# Patient Record
Sex: Female | Born: 1970 | Race: Black or African American | Hispanic: No | Marital: Single | Smoking: Never smoker
Health system: Southern US, Community
[De-identification: ages and names within clinical notes are randomized; demographics above are authoritative.]

## PROBLEM LIST (undated history)

## (undated) HISTORY — PX: ANKLE SURGERY: SHX546

---

## 1993-07-24 HISTORY — PX: UTERINE FIBROID SURGERY: SHX826

## 1997-12-28 ENCOUNTER — Ambulatory Visit (HOSPITAL_COMMUNITY): Admission: RE | Admit: 1997-12-28 | Discharge: 1997-12-28 | Payer: Self-pay | Admitting: Obstetrics and Gynecology

## 1998-03-15 ENCOUNTER — Inpatient Hospital Stay (HOSPITAL_COMMUNITY): Admission: AD | Admit: 1998-03-15 | Discharge: 1998-03-18 | Payer: Self-pay | Admitting: Obstetrics and Gynecology

## 2000-10-23 ENCOUNTER — Emergency Department (HOSPITAL_COMMUNITY): Admission: EM | Admit: 2000-10-23 | Discharge: 2000-10-23 | Payer: Self-pay | Admitting: Emergency Medicine

## 2001-04-17 ENCOUNTER — Other Ambulatory Visit: Admission: RE | Admit: 2001-04-17 | Discharge: 2001-04-17 | Payer: Self-pay | Admitting: Obstetrics and Gynecology

## 2001-05-29 ENCOUNTER — Encounter (INDEPENDENT_AMBULATORY_CARE_PROVIDER_SITE_OTHER): Payer: Self-pay | Admitting: Specialist

## 2001-05-29 ENCOUNTER — Inpatient Hospital Stay (HOSPITAL_COMMUNITY): Admission: RE | Admit: 2001-05-29 | Discharge: 2001-05-31 | Payer: Self-pay | Admitting: Obstetrics and Gynecology

## 2001-07-24 HISTORY — PX: ABDOMINAL HYSTERECTOMY: SHX81

## 2004-02-17 ENCOUNTER — Emergency Department (HOSPITAL_COMMUNITY): Admission: EM | Admit: 2004-02-17 | Discharge: 2004-02-17 | Payer: Self-pay | Admitting: Emergency Medicine

## 2004-09-27 ENCOUNTER — Ambulatory Visit: Payer: Self-pay | Admitting: Family Medicine

## 2006-01-08 ENCOUNTER — Ambulatory Visit: Payer: Self-pay | Admitting: Family Medicine

## 2006-02-02 ENCOUNTER — Ambulatory Visit: Payer: Self-pay | Admitting: Family Medicine

## 2007-01-24 ENCOUNTER — Ambulatory Visit: Payer: Self-pay | Admitting: Family Medicine

## 2007-01-24 LAB — CONVERTED CEMR LAB
ALT: 18 units/L (ref 0–35)
Albumin: 3.6 g/dL (ref 3.5–5.2)
Alkaline Phosphatase: 96 units/L (ref 39–117)
BUN: 10 mg/dL (ref 6–23)
Basophils Absolute: 0 10*3/uL (ref 0.0–0.1)
Basophils Relative: 0.4 % (ref 0.0–1.0)
CO2: 27 meq/L (ref 19–32)
Calcium: 9.2 mg/dL (ref 8.4–10.5)
GFR calc Af Amer: 145 mL/min
GFR calc non Af Amer: 120 mL/min
Lymphocytes Relative: 39.7 % (ref 12.0–46.0)
Monocytes Relative: 6.4 % (ref 3.0–11.0)
Neutro Abs: 2.8 10*3/uL (ref 1.4–7.7)
Platelets: 298 10*3/uL (ref 150–400)
Saturation Ratios: 34.3 % (ref 20.0–50.0)
TSH: 0.84 microintl units/mL (ref 0.35–5.50)

## 2007-02-01 ENCOUNTER — Ambulatory Visit: Payer: Self-pay | Admitting: Family Medicine

## 2007-02-01 LAB — CONVERTED CEMR LAB
Cholesterol: 170 mg/dL (ref 0–200)
HDL: 46.4 mg/dL (ref >39.0)
Triglycerides: 87 mg/dL (ref 0–149)

## 2007-07-11 ENCOUNTER — Telehealth: Payer: Self-pay | Admitting: Family Medicine

## 2007-07-19 ENCOUNTER — Telehealth: Payer: Self-pay | Admitting: Internal Medicine

## 2007-08-07 ENCOUNTER — Emergency Department (HOSPITAL_COMMUNITY): Admission: EM | Admit: 2007-08-07 | Discharge: 2007-08-07 | Payer: Self-pay | Admitting: *Deleted

## 2007-08-09 ENCOUNTER — Ambulatory Visit: Payer: Self-pay | Admitting: Family Medicine

## 2007-08-09 DIAGNOSIS — I491 Atrial premature depolarization: Secondary | ICD-10-CM

## 2008-08-17 ENCOUNTER — Telehealth: Payer: Self-pay | Admitting: *Deleted

## 2008-11-04 DIAGNOSIS — B372 Candidiasis of skin and nail: Secondary | ICD-10-CM | POA: Insufficient documentation

## 2008-11-06 ENCOUNTER — Ambulatory Visit: Payer: Self-pay | Admitting: Family Medicine

## 2009-01-23 ENCOUNTER — Emergency Department (HOSPITAL_COMMUNITY): Admission: EM | Admit: 2009-01-23 | Discharge: 2009-01-23 | Payer: Self-pay | Admitting: Emergency Medicine

## 2009-03-14 ENCOUNTER — Emergency Department (HOSPITAL_COMMUNITY): Admission: EM | Admit: 2009-03-14 | Discharge: 2009-03-14 | Payer: Self-pay | Admitting: Emergency Medicine

## 2009-08-30 ENCOUNTER — Telehealth: Payer: Self-pay | Admitting: Family Medicine

## 2009-09-01 ENCOUNTER — Telehealth: Payer: Self-pay | Admitting: Family Medicine

## 2010-08-25 NOTE — Progress Notes (Signed)
Summary: REQ FOR MED RX  Phone Note Call from Patient   Caller: Patient [phone] Reason for Call: Refill Medication Summary of Call: Pt called to adv that her insurance pays for the Ibuprofen 800 mg and if she has to get it OTC it will come out of her expenses.... Pt req RX for same be called into [facility]  [address]  Pt can be reached at [phone] with any questions or concerns.  Initial call taken by: Debbra Riding,  September 01, 2009 1:31 PM  Follow-up for Phone Call        Motrin 800 mg, dispense 60 tablets, directions one p.o. b.i.d., refills x 2 Follow-up by: Roderick Pee MD,  September 01, 2009 1:34 PM  Additional Follow-up for Phone Call Additional follow up Details #1::        Rx Called In Additional Follow-up by: Kern Reap CMA Duncan Dull),  September 02, 2009 4:11 PM

## 2010-08-25 NOTE — Progress Notes (Signed)
Summary: refill  Phone Note From Pharmacy   Summary of Call: patient would like a refill of ibuprofen 800 is this okay to fill? Initial call taken by: Kern Reap CMA Duncan Dull),  August 30, 2009 8:49 AM  Follow-up for Phone Call        Motrin 800 mg b.i.d., dispensed 60 tabs, refills x 2........ inform patient if she can get this OTC without a prescription.......... also she is due for a complete physical examination this spring Follow-up by: Roderick Pee MD,  August 30, 2009 11:35 AM  Additional Follow-up for Phone Call Additional follow up Details #1::        denied and sent to pharmacy with note Additional Follow-up by: Kern Reap CMA Duncan Dull),  August 30, 2009 2:55 PM

## 2010-10-29 LAB — DIFFERENTIAL
Basophils Relative: 1 % (ref 0–1)
Eosinophils Absolute: 0.1 10*3/uL (ref 0.0–0.7)
Eosinophils Relative: 2 % (ref 0–5)
Monocytes Relative: 4 % (ref 3–12)
Neutrophils Relative %: 51 % (ref 43–77)

## 2010-10-29 LAB — CBC
HCT: 41 % (ref 36.0–46.0)
MCHC: 34.1 g/dL (ref 30.0–36.0)
MCV: 89.9 fL (ref 78.0–100.0)
Platelets: 322 10*3/uL (ref 150–400)

## 2010-10-29 LAB — BASIC METABOLIC PANEL
BUN: 12 mg/dL (ref 6–23)
CO2: 24 mEq/L (ref 19–32)
Chloride: 104 mEq/L (ref 96–112)
Creatinine, Ser: 0.71 mg/dL (ref 0.4–1.2)
Potassium: 3.4 mEq/L — ABNORMAL LOW (ref 3.5–5.1)

## 2010-12-06 NOTE — Op Note (Signed)
NAMEJACKELYNN, Jody Solis NO.:  [id number]   MEDICAL RECORD NO.:  [id number]          PATIENT TYPE:  EMS   LOCATION:  ED                           FACILITY:  [facility]   PHYSICIAN:  Claude Manges. Whitfield, M.D.DATE OF BIRTH:  10-Dec-1970   DATE OF PROCEDURE:  03/14/2009  DATE OF DISCHARGE:  03/14/2009                               OPERATIVE REPORT   PREOPERATIVE DIAGNOSIS:  Displaced trimalleolar fracture right ankle  with dislocation of right ankle.   POSTOPERATIVE DIAGNOSIS:  Displaced trimalleolar fracture right ankle  with dislocation of right ankle.   PROCEDURE:  Closed reduction of trimalleolar fracture right ankle and  dislocation of right ankle.   SURGEON:  Claude Manges. Cleophas Dunker, MD   ASSISTANT:  Oris Drone. Petrarca, P.A.-C.   ANESTHESIA:  IV fentanyl and IV etomidate.   COMPLICATIONS:  None.   HISTORY:  A 40 year old female had fallen while roller skating and  sustained immediate onset of right ankle pain associated with  deformities.  She was transported to the [facility] Emergency Room  where x-rays were obtained.  She has a displaced trimalleolar fracture  of the right ankle associated with posterior lateral dislocation.  She  is now to have closed reduction of the fracture and dislocation.   PROCEDURE:  The patient came to the Emergency Room in a posterior  splint, this was left in place.  She had some initial tingling in her  toes, but had good pulses.  There was obvious lateral and posterior  position of the foot in relationship to the ankle.  Skin was intact.  There was no bleeding.   The emergency room physician administered IV fentanyl and the etomidate.  The patient was then at that point very comfortable, was unaware for  pain.  A closed reduction of the fracture was performed with an audible  and palpable relocation with the ankle at approximately neutral  position.  A bulky soft dressing associated with the posterior splint  was then applied.   The posterior tibial and dorsalis pedis pulses were  bonding and the patient did upon awakening and felt that she had better  since she had normal sensibility.  She was very comfortable at that  point.   Plan; walker, oxycodone for pain, and we will plan to proceed with open  reduction internal fixation of the trimalleolar fracture within the next  5-7 days as the swelling subsides and we plan to check her back in the  office prior to scheduling the surgery.     Claude Manges. Cleophas Dunker, M.D.  Electronically Signed    PWW/[id number]  D:  03/14/2009  T:  03/15/2009  Job:  [id number]

## 2010-12-07 ENCOUNTER — Inpatient Hospital Stay (HOSPITAL_COMMUNITY)
Admission: AD | Admit: 2010-12-07 | Discharge: 2010-12-07 | Disposition: A | Discharge status: Home / Self Care | Payer: Medicaid Other | Source: Ambulatory Visit | Attending: Family Medicine | Admitting: Family Medicine

## 2010-12-07 DIAGNOSIS — N949 Unspecified condition associated with female genital organs and menstrual cycle: Secondary | ICD-10-CM | POA: Insufficient documentation

## 2010-12-07 DIAGNOSIS — N938 Other specified abnormal uterine and vaginal bleeding: Secondary | ICD-10-CM | POA: Insufficient documentation

## 2010-12-07 LAB — WET PREP, GENITAL
Trich, Wet Prep: NONE SEEN
Yeast Wet Prep HPF POC: NONE SEEN

## 2010-12-08 LAB — GC/CHLAMYDIA PROBE AMP, GENITAL: Chlamydia, DNA Probe: NEGATIVE

## 2010-12-09 NOTE — Op Note (Signed)
[facility]  Patient:    Jody Solis, Jody Solis Visit Number: [id number] MRN: [id number]          Service Type: GYN Location: 4W 0452 01 Attending Physician:  Malon Kindle Dictated by:   Malachi Pro. Ambrose Mantle, M.D. Proc. Date: 05/29/01 Admit Date:  05/29/2001 Discharge Date: 05/31/2001                             Operative Report  PREOPERATIVE DIAGNOSES:  1. Leiomyomata uteri, recurrent. 2. Dysmenorrhia. 3. Menorrhagia. 4. Dyspareunia.  POSTOPERATIVE DIAGNOSES:  1. Leiomyomata uteri, recurrent. 2. Dysmenorrhia. 3. Menorrhagia. 4. Dyspareunia with omental adhesions to the left anterior abdominal wall. 5. Adhesions of the bladder to the anterior lower uterine segment and adhesions of the right ovary to the anterior abdominal wall.  OPERATION:  Abdominal hysterectomy, lysis of adhesions.  SURGEON:  Malachi Pro. Ambrose Mantle, M.D.  ASSISTANT:  Alvino Chapel, M.D.  ANESTHESIA:  General.  DESCRIPTION OF PROCEDURE:  The patient was brought to the operating room and placed under satisfactory general anesthesia, placed in frogleg position.  The abdomen, vagina and urethra were prepped with Betadine solution and a Foley catheter was inserted to straight drain.  It should be noted that the patients abdominal wall is quite unusual in appearance.  There is a very short distance from the pubis to the umbilicus because of her three previous transverse incisions through basically the same scar.  There is some scarring there.  There is also a lot of redundant abdominal wall tissue in this short space from the umbilicus to the pubis.  The patient was placed supine.  The abdomen was draped as a sterile field and the old incision was utilized. After I examined her, I felt that the uterus was mobile enough that I would not be unable to get into the abdominal cavity.  I made the incision through the old scar, carried in layers through dense scarring to the  fascia, incised the fascia transversely with great care, separated it from the rectus muscle superiorly and inferiorly, and then entered the abdominal cavity.  There was quite a bit of fluid in the abdominal cavity for no apparent reason.  The upper abdomen was explored.  Both kidneys felt normal.  The liver was smooth without abnormalities.  The gallbladder was smooth and cystic without stones.  Exploration of the pelvis revealed the previously described abnormalities.  The first thing I did was took away the omental adhesions to the left anterior abdominal wall.  These were taken down with a Bovie and then because of some bleeding, the omentum was tied with several ties of 0 Vicryl.  There did not seem to be any bleeding from the abdominal wall side.  I then explored the uterus.  It was enlarged to about 12 weeks size with multiple fibroids. There was dense scarring on the anterior cul-de-sac.  There was also a dense band of connective tissue adhering the ovary to the right anterior abdominal wall.  I elevated the uterus into the operative field.  The tubes and ovaries especially appeared normal except the left ovary was somewhat adherent to the left pelvic wall.  I clamped the upper pedicles and then divided them between clamps and suture ligated the upper pedicles.  I then marched down the sides of the uterus, clamping, cutting and suture ligating the parametrial tissues, incised the scar tissue over the lower uterine segment with sharp dissection, reached a  plane that would allow me to push the bladder inferiorly.  I then continued to work downward.  I clamped, cut and suture ligated the parametrial, paracervical tissues and then the uterosacral ligaments, entered the right angle of the vagina, and then removed the uterus by transecting the upper vagina.  Vaginal angle sutures were placed and then the central portion of the vagina was closed with interrupted figure-of-eight sutures of  0 Vicryl.  I established hemostasis.  The most troublesome part was on the right broad ligament.  I then liberally irrigated the pelvis and felt that hemostasis was adequate.  I sutured the uterosacral ligaments together in the midline.  I identified both ureters, both by visualization and palpation.  They seemed to be normal in course and caliber.  I then filled the bladder with methylene blue stained fluid.  There was no leakage and there was no area close to any sutures.  I reperitonealized partially over the vaginal cuff.  However, complete reperitonealization was not done because the anterior peritoneum was so short.  At this point hemostasis appeared completely adequate.  I removed the packs from the pelvis and the retractors.  I closed the abdominal wall with several interrupted figure-of-eight sutures of 0 Vicryl on the peritoneum and muscle, secured hemostasis with Bovie, then closed the fascia with two running sutures of 0 Vicryl.  The right side of the abdominal wall was closed with a fairly short suture and at the time I tied it, it seemed to be quite short so I reinforced the closure on the right side with several other figure-of-eight sutures of 0 Vicryl.  The subcutaneous tissue was somewhat asymmetric.  There was mor tissue superiorly than inferiorly as was predicted from the appearance of the abdominal wall preoperatively.  So I tried to neutralize this difference with interrupted sutures of 3-0 Vicryl on the subcutaneous tissue and then closed the skin with automatic staples.  The patient seemed to tolerate the procedure.  Blood loss was estimated at 450 cc.  Sponge and needle counts were correct.  She was returned to recovery in satisfactory condition. Dictated by:   Malachi Pro. Ambrose Mantle, M.D. Attending Physician:  Malon Kindle DD:  05/29/01 TD:  05/30/01 Job: [id number]

## 2010-12-09 NOTE — H&P (Signed)
[facility]  Patient:    Jody Solis, Jody Solis Visit Number: [id number] MRN: [id number]          Service Type: GYN Location: 4W 0452 01 Attending Physician:  Malon Kindle Dictated by:   Malachi Pro. Ambrose Mantle, M.D. Admit Date:  05/29/2001                           History and Physical  HISTORY OF PRESENT ILLNESS:  This is a 40 year old black female, para 2-0-1-2, who is admitted to the hospital for abdominal hysterectomy, either supracervical or total hysterectomy, depending on the patients wishes, because of fibroids, dysmenorrhea, menorrhagia, and dyspareunia.  Last menstrual period April 28, 2001.  The patient reports that her periods occur at 28-30 day intervals, last about five days, are quite heavy, and are very painful.  She states the pain is an 8-9/10.  She claims to pass 25 cents sized clots and uses many pads per day.  She has also had some pain with intercourse.  She has had a previous myomectomy and now appears to have more fibroids.  PAST MEDICAL HISTORY:  ALLERGIES:  No known allergies.  She does get an upset stomach with PENICILLIN.  MEDICATIONS:  She is on a sinus medication p.r.n.  OPERATIONS:  Two C-sections and tubal ligation with the second C-section. Multiple myomectomies.  ILLNESSES:  Usual childhood diseases.  No adult illnesses of note.  No heart ailments.  SOCIAL HISTORY:  Drinks rarely, does not smoke.  REVIEW OF SYSTEMS:  Occasional headaches, otherwise within normal limits.  FAMILY HISTORY:  Mother, 65, with high blood pressure and asthma.  Father, 61, with high blood pressure and diabetes.  Seven sisters, one of whom has lupus. Two brothers are living and well.  PHYSICAL EXAMINATION:  GENERAL:  Reveals a well-developed, well-nourished black female in no acute distress.  VITAL SIGNS:  Blood pressure 136/80, pulse 80, weight 175-3/4 pounds.  HEENT:  Reveal no cranial abnormalities.  Extraocular  movements are intact. Nose and pharynx are clear.  NECK:  Supple without thyromegaly.  HEART:  Normal size and sounds.  No murmurs.  LUNGS:  Clear to P&A.  BREASTS:  Breasts were examined in September of 2002 and were normal.  ABDOMEN:  Soft, not tender.  There is lots of redundant lower abdominal skin. There is a short distance between the pubis and the umbilicus.  A suprapubic mass is palpable.  PELVIC:  The vulva and vagina are clean.  Pap smear on April 17, 2001 was within normal limits.  The cervix is clean.  The uterus is anterior, thought to be about 12 weeks size and irregular and firm.  The adnexae are free of masses.  ADMITTING IMPRESSION: 1. Leiomyomata uteri. 2. Dysmenorrhea. 3. Menorrhagia. 4. Dyspareunia.  PLAN:  The patient is admitted for abdominal hysterectomy.  She has mentioned maybe keeping her cervix.  She will make a final decision on that the day of surgery.  She does not want her ovaries removed unless they are diseased.  It was noted at the time of the myomectomies that the left ovary was adherent to the left pelvic wall, but did not seem to be diseased in any other manner. The patient has been counseled about the risks of surgery, including, but not limited to, thrombophlebitis and pulmonary embolism, wound disruption, hemorrhage with need for reoperation and/or transfusion, fistula formation, nerve injury, intestinal obstruction, and other unforeseen events.  She understands and agrees  to proceed. Dictated by:   Malachi Pro. Ambrose Mantle, M.D. Attending Physician:  Malon Kindle DD:  05/28/01 TD:  05/29/01 Job: [id number]

## 2010-12-09 NOTE — Discharge Summary (Signed)
[facility]  Patient:    Jody Solis, Jody Solis Visit Number: [id number] MRN: [id number]          Service Type: GYN Location: 4W 0452 01 Attending Physician:  Malon Kindle Dictated by:   Malachi Pro. Ambrose Mantle, M.D. Admit Date:  05/29/2001 Discharge Date: 05/31/2001                             Discharge Summary  HISTORY OF PRESENT ILLNESS:  This is a 40 year old black female with fibroids, dysmenorrhea and menorrhagia, also with dyspareunia admitted for hysterectomy. The patient underwent the procedure on May 29, 2001 by Dr. Ambrose Mantle with Dr. Senaida Ores assisting.  Findings were omental adhesions to the left anterior abdominal wall, adhesions of the bladder to the anterior lower uterine segment, adhesions of the right ovary to the anterior abdominal wall and several leiomyomata uteri causing the uterus to be a total size of about [redacted] weeks gestation.  The procedure was done under general anesthesia with blood loss of about 400 cc.  Postoperatively the patient did well.  Her T-max was 100.3 degrees at 4:00 p.m. on the first postoperative day.  She is ambulating well.  She is tolerating liquids, voiding without difficulty.  The abdomen is soft, nontender and nondistended.  Incision is healing well.  She is considered a candidate for discharge, even though she has not passed flatus.  She is advised to stay on liquids until she passes flatus.  She may use a suppository.  LABORATORY DATA:  Pathology report is not back at this time.  Urinalysis was negative.  Initial hemoglobin was 12.2, hematocrit 36.6, white count 3600, platelet count 345,000, 39 segs, 51 lymphs, 3 eosinophils, 7 monocytes. Comprehensive metabolic profile was completely within normal limits.  Follow up hemoglobin is 11.8 and 11.4, hematocrit 34.2 and 33.0.  FINAL DIAGNOSES: 1. Leiomyomata uteri. 2. Pelvic adhesions. 3. Dysmenorrhea. 4. Menorrhagia. 5. Dyspareunia.  OPERATION:   Abdominal hysterectomy, lyses of adhesions.  FINAL CONDITION:  Improved.  INSTRUCTIONS:  Regular discharge instructions to include no vaginal entrance for at least six weeks.  No heavy lifting or strenuous activity.  Report any temperature elevation greater than 100.6 degrees.  Call with any unusual problems.  The patient is to call our office to come to see me in three to four days for follow up examination and removal of her staples.  MEDICATIONS:  Mepergan Forta #16 tablets one every four to six hours as needed for pain. Dictated by:   Malachi Pro. Ambrose Mantle, M.D. Attending Physician:  Malon Kindle DD:  05/31/01 TD:  06/01/01 Job: [id number]

## 2011-04-12 LAB — POCT CARDIAC MARKERS
CKMB, poc: 1.6
Myoglobin, poc: 44.1
Troponin i, poc: <0.05

## 2011-04-12 LAB — CBC
HCT: 39.7
Hemoglobin: 13.7
MCV: 89.7
Platelets: 332
WBC: 7.3

## 2011-04-12 LAB — BASIC METABOLIC PANEL
BUN: 12
Chloride: 100
GFR calc non Af Amer: >60
Potassium: 3.8
Sodium: 135

## 2011-05-04 ENCOUNTER — Other Ambulatory Visit: Payer: Medicaid Other

## 2011-05-22 ENCOUNTER — Encounter: Payer: Medicaid Other | Admitting: Family Medicine

## 2011-05-22 ENCOUNTER — Encounter: Payer: Self-pay | Admitting: Family Medicine

## 2011-12-21 ENCOUNTER — Emergency Department (HOSPITAL_COMMUNITY): Payer: No Typology Code available for payment source

## 2011-12-21 ENCOUNTER — Encounter (HOSPITAL_COMMUNITY): Payer: Self-pay | Admitting: Emergency Medicine

## 2011-12-21 ENCOUNTER — Emergency Department (HOSPITAL_COMMUNITY)
Admission: EM | Admit: 2011-12-21 | Discharge: 2011-12-21 | Disposition: A | Discharge status: Home / Self Care | Payer: No Typology Code available for payment source | Attending: Emergency Medicine | Admitting: Emergency Medicine

## 2011-12-21 DIAGNOSIS — M545 Low back pain, unspecified: Secondary | ICD-10-CM | POA: Insufficient documentation

## 2011-12-21 DIAGNOSIS — Y92410 Unspecified street and highway as the place of occurrence of the external cause: Secondary | ICD-10-CM | POA: Insufficient documentation

## 2011-12-21 MED ORDER — CYCLOBENZAPRINE HCL 10 MG PO TABS: 10.0000 mg | ORAL_TABLET | Freq: Two times a day (BID) | ORAL | Status: AC | PRN

## 2011-12-21 MED ORDER — HYDROCODONE-ACETAMINOPHEN 5-500 MG PO TABS: 1.0000 | ORAL_TABLET | Freq: Four times a day (QID) | ORAL | Status: AC | PRN

## 2011-12-21 NOTE — ED Notes (Signed)
Voiced understanding of instructions given 

## 2011-12-21 NOTE — ED Provider Notes (Signed)
Medical screening examination/treatment/procedure(s) were performed by non-physician practitioner and as supervising physician I was immediately available for consultation/collaboration.   Rolan Bucco, MD 12/21/11 [phone]
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
## 2013-01-30 ENCOUNTER — Emergency Department (HOSPITAL_COMMUNITY)
Admission: EM | Admit: 2013-01-30 | Discharge: 2013-01-30 | Disposition: A | Discharge status: Home / Self Care | Payer: Medicaid Other | Attending: Emergency Medicine | Admitting: Emergency Medicine

## 2013-01-30 ENCOUNTER — Encounter (HOSPITAL_COMMUNITY): Payer: Self-pay | Admitting: Emergency Medicine

## 2013-01-30 DIAGNOSIS — J3489 Other specified disorders of nose and nasal sinuses: Secondary | ICD-10-CM | POA: Insufficient documentation

## 2013-01-30 DIAGNOSIS — J019 Acute sinusitis, unspecified: Secondary | ICD-10-CM | POA: Insufficient documentation

## 2013-01-30 DIAGNOSIS — J329 Chronic sinusitis, unspecified: Secondary | ICD-10-CM

## 2013-01-30 MED ORDER — GUAIFENESIN ER 1200 MG PO TB12: 1.0000 | ORAL_TABLET | Freq: Two times a day (BID) | ORAL | Status: DC

## 2013-01-30 MED ORDER — AMOXICILLIN-POT CLAVULANATE 875-125 MG PO TABS: 1.0000 | ORAL_TABLET | Freq: Two times a day (BID) | ORAL | Status: DC

## 2013-01-30 NOTE — ED Notes (Signed)
Pt complains of drainage from sinus area following a "tooth being pulled 2 months ago"

## 2013-01-30 NOTE — ED Provider Notes (Signed)
History    CSN: [id number] Arrival date & time 01/30/13  1152  First MD Initiated Contact with Patient 01/30/13 1159     Chief Complaint  Patient presents with  . Facial Pain   (Consider location/radiation/quality/duration/timing/severity/associated sxs/prior Treatment) HPI Patient presents to the emergency department with a several month history of sinus pain and pressure.  Patient, states, that she had a tooth removed and then started noticing.  She will gargle with water it came to her nose.  She states that her dentist wasn't concerned and that he felt like he had an infection underneath the tooth that he had pulled.  The patient, states, that she has not had any chest pain, shortness of breath, nausea, vomiting, headache, blurred vision, fever, or weakness, dizziness, or syncope.  The patient, states, that she's had nasal congestion, since that time, with increasing pressure.  Patient, states the symptoms have been constant.  Patient does not take any medications prior to arrival History reviewed. No pertinent past medical history. Past Surgical History  Procedure Laterality Date  . Uterine fibroid surgery  1995  . Abdominal hysterectomy  2003   Family History  Problem Relation Age of Onset  . Asthma Mother   . Hypertension Mother   . Diabetes Father   . Hypertension Father   . Healthy Sister   . Healthy Brother   . Healthy Brother   . Healthy Sister   . Healthy Sister   . Healthy Sister   . Healthy Sister   . Healthy Sister   . Lupus Sister    History  Substance Use Topics  . Smoking status: Never Smoker   . Smokeless tobacco: Not on file  . Alcohol Use: No   OB History   Grav Para Term Preterm Abortions TAB SAB Ect Mult Living                 Review of Systems All other systems negative except as documented in the HPI. All pertinent positives and negatives as reviewed in the HPI. Allergies  Review of patient's allergies indicates no known allergies.  Home  Medications   Current Outpatient Rx  Name  Route  Sig  Dispense  Refill  . ibuprofen (ADVIL,MOTRIN) 200 MG tablet   Oral   Take 200-600 mg by mouth every 6 (six) hours as needed. For pain          BP 144/78  Pulse 58  Temp(Src) 98.4 F (36.9 C) (Oral)  Resp 18  SpO2 100% Physical Exam  Nursing note and vitals reviewed. Constitutional: She is oriented to person, place, and time. She appears well-developed and well-nourished.  HENT:  Head: Normocephalic and atraumatic.  Right Ear: Tympanic membrane normal.  Left Ear: Tympanic membrane normal.  Nose: Mucosal edema present. No rhinorrhea or sinus tenderness. Right sinus exhibits no maxillary sinus tenderness and no frontal sinus tenderness. Left sinus exhibits maxillary sinus tenderness. Left sinus exhibits no frontal sinus tenderness.  Mouth/Throat: Uvula is midline, oropharynx is clear and moist and mucous membranes are normal.  Eyes: Pupils are equal, round, and reactive to light.  Cardiovascular: Normal rate, regular rhythm and normal heart sounds.  Exam reveals no gallop and no friction rub.   No murmur heard. Pulmonary/Chest: Effort normal and breath sounds normal. No respiratory distress.  Neurological: She is alert and oriented to person, place, and time.  Skin: Skin is warm and dry.    ED Course  Procedures (including critical care time) Patient be treated for  sinusitis and referred to ENT.  Told to return here as needed.  Increase her fluid intake  MDM    Carlyle Dolly, PA-C 01/30/13 1208

## 2013-01-31 NOTE — ED Provider Notes (Signed)
Medical screening examination/treatment/procedure(s) were performed by non-physician practitioner and as supervising physician I was immediately available for consultation/collaboration.  Burna Atlas, MD 01/31/13 0728 

## 2013-04-05 ENCOUNTER — Emergency Department (HOSPITAL_COMMUNITY): Payer: Medicaid Other

## 2013-04-05 ENCOUNTER — Encounter (HOSPITAL_COMMUNITY): Payer: Self-pay | Admitting: *Deleted

## 2013-04-05 ENCOUNTER — Emergency Department (HOSPITAL_COMMUNITY)
Admission: EM | Admit: 2013-04-05 | Discharge: 2013-04-05 | Disposition: A | Discharge status: Home / Self Care | Payer: Medicaid Other | Attending: Emergency Medicine | Admitting: Emergency Medicine

## 2013-04-05 DIAGNOSIS — M545 Low back pain: Secondary | ICD-10-CM

## 2013-04-05 DIAGNOSIS — Y9289 Other specified places as the place of occurrence of the external cause: Secondary | ICD-10-CM | POA: Insufficient documentation

## 2013-04-05 DIAGNOSIS — W1809XA Striking against other object with subsequent fall, initial encounter: Secondary | ICD-10-CM | POA: Insufficient documentation

## 2013-04-05 DIAGNOSIS — Y9389 Activity, other specified: Secondary | ICD-10-CM | POA: Insufficient documentation

## 2013-04-05 DIAGNOSIS — M5416 Radiculopathy, lumbar region: Secondary | ICD-10-CM

## 2013-04-05 DIAGNOSIS — IMO0002 Reserved for concepts with insufficient information to code with codable children: Secondary | ICD-10-CM | POA: Insufficient documentation

## 2013-04-05 MED ORDER — HYDROCODONE-ACETAMINOPHEN 5-325 MG PO TABS: 1.0000 | ORAL_TABLET | ORAL | Status: DC | PRN

## 2013-04-05 MED ORDER — CYCLOBENZAPRINE HCL 10 MG PO TABS: 10.0000 mg | ORAL_TABLET | Freq: Two times a day (BID) | ORAL | Status: DC | PRN

## 2013-04-05 MED ORDER — IBUPROFEN 800 MG PO TABS: 800.0000 mg | ORAL_TABLET | Freq: Three times a day (TID) | ORAL | Status: DC

## 2013-04-05 NOTE — ED Notes (Signed)
Pt states she fell 2 months ago, landed on buttocks on cement. Reports immediate headache after fall. States she was "disoriented" but denies LOC. Pain has since gotten worse. Reports left sided lower back pain, radiates to left buttocks. Reports taking ibuprofen without relief of symptoms

## 2013-04-05 NOTE — ED Provider Notes (Signed)
Medical screening examination/treatment/procedure(s) were performed by non-physician practitioner and as supervising physician I was immediately available for consultation/collaboration.  Adelfa Lozito F Jerrika Ledlow, MD 04/05/13 2023 

## 2013-04-05 NOTE — ED Provider Notes (Signed)
CSN: [id number]     Arrival date & time 04/05/13  7829 History   First MD Initiated Contact with Patient 04/05/13 1013     Chief Complaint  Patient presents with  . Back Pain   (Consider location/radiation/quality/duration/timing/severity/associated sxs/prior Treatment) Patient is a 42 y.o. female presenting with back pain. The history is provided by the patient.  Back Pain Associated symptoms: no abdominal pain, no fever, no headaches, no numbness and no weakness   Associated symptoms comment:  She fell 2 months ago and is here with complaint of persistent lower left back pain that radiates into the posterior left leg to mid-thigh. No weakness, numbness or tingling. She denies urinary or bowel incontinence. Her pain is no worse but is constant. It is worse with certain positions, including supine. No abdominal pain.   History reviewed. No pertinent past medical history. Past Surgical History  Procedure Laterality Date  . Uterine fibroid surgery  1995  . Abdominal hysterectomy  2003  . Cesarean section    . Ankle surgery     Family History  Problem Relation Age of Onset  . Asthma Mother   . Hypertension Mother   . Diabetes Father   . Hypertension Father   . Healthy Sister   . Healthy Brother   . Healthy Brother   . Healthy Sister   . Healthy Sister   . Healthy Sister   . Healthy Sister   . Healthy Sister   . Lupus Sister    History  Substance Use Topics  . Smoking status: Never Smoker   . Smokeless tobacco: Not on file  . Alcohol Use: No   OB History   Grav Para Term Preterm Abortions TAB SAB Ect Mult Living                 Review of Systems  Constitutional: Negative for fever and chills.  Gastrointestinal: Negative.  Negative for nausea and abdominal pain.  Genitourinary: Negative.  Negative for difficulty urinating.  Musculoskeletal: Positive for back pain.  Skin: Negative.   Neurological: Negative.  Negative for weakness, numbness and headaches.     Allergies  Review of patient's allergies indicates no known allergies.  Home Medications  No current outpatient prescriptions on file. BP 94/60  Pulse 71  Temp(Src) 98.7 F (37.1 C) (Oral)  Resp 16  SpO2 100% Physical Exam  Constitutional: She is oriented to person, place, and time. She appears well-developed and well-nourished.  Neck: Normal range of motion.  Cardiovascular: Intact distal pulses.   Pulmonary/Chest: Effort normal.  Abdominal: There is no tenderness.  Musculoskeletal: She exhibits no edema.  Lower left back tenderness. No sciatic tenderness. No lower extremity swelling. No strength deficits.   Neurological: She is alert and oriented to person, place, and time. She has normal reflexes.  Skin: Skin is warm and dry.    ED Course  Procedures (including critical care time) Labs Review Labs Reviewed - No data to display Imaging Review No results found. Dg Lumbar Spine Complete  04/05/2013   CLINICAL DATA:  42 year old female status post fall with increasing pain.  EXAM: LUMBAR SPINE - COMPLETE 4+ VIEW  COMPARISON:  12/21/2011.  FINDINGS: Transitional anatomy , same numbering system as on the comparison. Vestigial L5-S1 disc suspected. Chronically advanced L4-L5 disc and endplate degeneration with chronic vacuum disc. Moderate lower lumbar facet hypertrophy. No lumbar compression fracture. Sacral ala and SI joints within normal limits.  IMPRESSION: No acute osseous abnormality identified in the lumbar spine. Transitional anatomy,  same numbering system as on comparison. Chronically advanced L4-L5 disc degeneration.   Electronically Signed   By: Augusto Gamble M.D.   On: 04/05/2013 10:57   MDM  No diagnosis found. 1. Low back pain 2. Lumbar radiculopathy  No neurologic deficits on exam. Will treat with pain relief and ortho follow up.     Arnoldo Hooker, PA-C 04/05/13 1104

## 2013-04-05 NOTE — ED Notes (Signed)
Pt has lost 2 pant sizes over last few months. Pt was concerned about low BP

## 2014-09-12 ENCOUNTER — Encounter (HOSPITAL_COMMUNITY): Payer: Self-pay | Admitting: Emergency Medicine

## 2014-09-12 ENCOUNTER — Emergency Department (HOSPITAL_COMMUNITY)
Admission: EM | Admit: 2014-09-12 | Discharge: 2014-09-12 | Disposition: A | Discharge status: Home / Self Care | Payer: Medicaid Other | Attending: Emergency Medicine | Admitting: Emergency Medicine

## 2014-09-12 DIAGNOSIS — M545 Low back pain, unspecified: Secondary | ICD-10-CM

## 2014-09-12 DIAGNOSIS — J069 Acute upper respiratory infection, unspecified: Secondary | ICD-10-CM | POA: Insufficient documentation

## 2014-09-12 DIAGNOSIS — Z79899 Other long term (current) drug therapy: Secondary | ICD-10-CM | POA: Insufficient documentation

## 2014-09-12 NOTE — ED Notes (Addendum)
Pt from home c/o stuff nose, drainage, and facial pressure since Tuesday. She also c/o low back pain that is worse with movement, denies urinary symptoms and denies injury.

## 2014-09-12 NOTE — ED Provider Notes (Signed)
CSN: [id number]     Arrival date & time 09/12/14  0803 History   First MD Initiated Contact with Patient 09/12/14 [phone]     Chief Complaint  Patient presents with  . Nasal Congestion  . Back Pain     (Consider location/radiation/quality/duration/timing/severity/associated sxs/prior Treatment) HPI Comments: Patient here complaining of urinary symptoms consisting of stuffy nose, drainage, facial pressure. Denies any fever or chills. No cough or congestion. Denies any rashes at this time. Did have a scratchy throat which is since resolved. Denies any ear pain. No urinary symptoms but does note bilateral lower back pain which started after she got a new mattress. Back pain characterized as sharp and worse with certain positions without radiation to her legs. Symptoms persistent and no medications taken prior to arrival.  Patient is a 44 y.o. female presenting with back pain. The history is provided by the patient.  Back Pain   History reviewed. No pertinent past medical history. Past Surgical History  Procedure Laterality Date  . Uterine fibroid surgery  1995  . Abdominal hysterectomy  2003  . Cesarean section    . Ankle surgery     Family History  Problem Relation Age of Onset  . Asthma Mother   . Hypertension Mother   . Diabetes Father   . Hypertension Father   . Healthy Sister   . Healthy Brother   . Healthy Brother   . Healthy Sister   . Healthy Sister   . Healthy Sister   . Healthy Sister   . Healthy Sister   . Lupus Sister    History  Substance Use Topics  . Smoking status: Never Smoker   . Smokeless tobacco: Not on file  . Alcohol Use: No   OB History    No data available     Review of Systems  Musculoskeletal: Positive for back pain.  All other systems reviewed and are negative.     Allergies  Review of patient's allergies indicates no known allergies.  Home Medications   Prior to Admission medications   Medication Sig Start Date End Date Taking?  Authorizing Provider  cyclobenzaprine (FLEXERIL) 10 MG tablet Take 1 tablet (10 mg total) by mouth 2 (two) times daily as needed for muscle spasms. 04/05/13   Shari A Upstill, PA-C  HYDROcodone-acetaminophen (NORCO/VICODIN) 5-325 MG per tablet Take 1 tablet by mouth every 4 (four) hours as needed for pain. 04/05/13   Shari A Upstill, PA-C  ibuprofen (ADVIL,MOTRIN) 800 MG tablet Take 1 tablet (800 mg total) by mouth 3 (three) times daily. 04/05/13   Shari A Upstill, PA-C   BP 126/81 mmHg  Pulse 84  Temp(Src) 97.6 F (36.4 C) (Oral)  Resp 18  SpO2 99% Physical Exam  Constitutional: She is oriented to person, place, and time. She appears well-developed and well-nourished.  Non-toxic appearance. No distress.  HENT:  Head: Normocephalic and atraumatic.  Eyes: Conjunctivae, EOM and lids are normal. Pupils are equal, round, and reactive to light.  Neck: Normal range of motion. Neck supple. No tracheal deviation present. No thyroid mass present.  Cardiovascular: Normal rate, regular rhythm and normal heart sounds.  Exam reveals no gallop.   No murmur heard. Pulmonary/Chest: Effort normal and breath sounds normal. No stridor. No respiratory distress. She has no decreased breath sounds. She has no wheezes. She has no rhonchi. She has no rales.  Abdominal: Soft. Normal appearance and bowel sounds are normal. She exhibits no distension. There is no tenderness. There is no rebound  and no CVA tenderness.  Musculoskeletal: Normal range of motion. She exhibits no edema or tenderness.       Back:  Neurological: She is alert and oriented to person, place, and time. She has normal strength. No cranial nerve deficit or sensory deficit. GCS eye subscore is 4. GCS verbal subscore is 5. GCS motor subscore is 6.  Skin: Skin is warm and dry. No abrasion and no rash noted.  Psychiatric: She has a normal mood and affect. Her speech is normal and behavior is normal.  Nursing note and vitals reviewed.   ED Course    Procedures (including critical care time) Labs Review Labs Reviewed - No data to display  Imaging Review No results found.   EKG Interpretation None      MDM   Final diagnoses:  URI (upper respiratory infection)  Bilateral low back pain without sciatica    Pt with likely uri and musculoskeletal back pain from sleeping on a new mattress    Toy BakerAnthony T Keerat Denicola, MD 09/12/14 [phone]

## 2014-09-12 NOTE — Discharge Instructions (Signed)
°Upper Respiratory Infection, Adult °An upper respiratory infection (URI) is also sometimes known as the common cold. The upper respiratory tract includes the nose, sinuses, throat, trachea, and bronchi. Bronchi are the airways leading to the lungs. Most people improve within 1 week, but symptoms can last up to 2 weeks. A residual cough may last even longer.  °CAUSES °Many different viruses can infect the tissues lining the upper respiratory tract. The tissues become irritated and inflamed and often become very moist. Mucus production is also common. A cold is contagious. You can easily spread the virus to others by oral contact. This includes kissing, sharing a glass, coughing, or sneezing. Touching your mouth or nose and then touching a surface, which is then touched by another person, can also spread the virus. °SYMPTOMS  °Symptoms typically develop 1 to 3 days after you come in contact with a cold virus. Symptoms vary from person to person. They may include: °· Runny nose. °· Sneezing. °· Nasal congestion. °· Sinus irritation. °· Sore throat. °· Loss of voice (laryngitis). °· Cough. °· Fatigue. °· Muscle aches. °· Loss of appetite. °· Headache. °· Low-grade fever. °DIAGNOSIS  °You might diagnose your own cold based on familiar symptoms, since most people get a cold 2 to 3 times a year. Your caregiver can confirm this based on your exam. Most importantly, your caregiver can check that your symptoms are not due to another disease such as strep throat, sinusitis, pneumonia, asthma, or epiglottitis. Blood tests, throat tests, and X-rays are not necessary to diagnose a common cold, but they may sometimes be helpful in excluding other more serious diseases. Your caregiver will decide if any further tests are required. °RISKS AND COMPLICATIONS  °You may be at risk for a more severe case of the common cold if you smoke cigarettes, have chronic heart disease (such as heart failure) or lung disease (such as asthma), or  if you have a weakened immune system. The very young and very old are also at risk for more serious infections. Bacterial sinusitis, middle ear infections, and bacterial pneumonia can complicate the common cold. The common cold can worsen asthma and chronic obstructive pulmonary disease (COPD). Sometimes, these complications can require emergency medical care and may be life-threatening. °PREVENTION  °The best way to protect against getting a cold is to practice good hygiene. Avoid oral or hand contact with people with cold symptoms. Wash your hands often if contact occurs. There is no clear evidence that vitamin C, vitamin E, echinacea, or exercise reduces the chance of developing a cold. However, it is always recommended to get plenty of rest and practice good nutrition. °TREATMENT  °Treatment is directed at relieving symptoms. There is no cure. Antibiotics are not effective, because the infection is caused by a virus, not by bacteria. Treatment may include: °· Increased fluid intake. Sports drinks offer valuable electrolytes, sugars, and fluids. °· Breathing heated mist or steam (vaporizer or shower). °· Eating chicken soup or other clear broths, and maintaining good nutrition. °· Getting plenty of rest. °· Using gargles or lozenges for comfort. °· Controlling fevers with ibuprofen or acetaminophen as directed by your caregiver. °· Increasing usage of your inhaler if you have asthma. °Zinc gel and zinc lozenges, taken in the first 24 hours of the common cold, can shorten the duration and lessen the severity of symptoms. Pain medicines may help with fever, muscle aches, and throat pain. A variety of non-prescription medicines are available to treat congestion and runny nose. Your caregiver   can make recommendations and may suggest nasal or lung inhalers for other symptoms.  HOME CARE INSTRUCTIONS   Only take over-the-counter or prescription medicines for pain, discomfort, or fever as directed by your  caregiver.  Use a warm mist humidifier or inhale steam from a shower to increase air moisture. This may keep secretions moist and make it easier to breathe.  Drink enough water and fluids to keep your urine clear or pale yellow.  Rest as needed.  Return to work when your temperature has returned to normal or as your caregiver advises. You may need to stay home longer to avoid infecting others. You can also use a face mask and careful hand washing to prevent spread of the virus. SEEK MEDICAL CARE IF:   After the first few days, you feel you are getting worse rather than better.  You need your caregiver's advice about medicines to control symptoms.  You develop chills, worsening shortness of breath, or brown or red sputum. These may be signs of pneumonia.  You develop yellow or brown nasal discharge or pain in the face, especially when you bend forward. These may be signs of sinusitis.  You develop a fever, swollen neck glands, pain with swallowing, or white areas in the back of your throat. These may be signs of strep throat. SEEK IMMEDIATE MEDICAL CARE IF:   You have a fever.  You develop severe or persistent headache, ear pain, sinus pain, or chest pain.  You develop wheezing, a prolonged cough, cough up blood, or have a change in your usual mucus (if you have chronic lung disease).  You develop sore muscles or a stiff neck. Document Released: 01/03/2001 Document Revised: 10/02/2011 Document Reviewed: 10/15/2013 [facility] Patient Information 2015 [place], [place]. This information is not intended to replace advice given to you by your health care provider. Make sure you discuss any questions you have with your health care provider. Back Pain, Adult Low back pain is very common. About 1 in 5 people have back pain.The cause of low back pain is rarely dangerous. The pain often gets better over time.About half of people with a sudden onset of back pain feel better in just 2 weeks. About  8 in 10 people feel better by 6 weeks.  CAUSES Some common causes of back pain include:  Strain of the muscles or ligaments supporting the spine.  Wear and tear (degeneration) of the spinal discs.  Arthritis.  Direct injury to the back. DIAGNOSIS Most of the time, the direct cause of low back pain is not known.However, back pain can be treated effectively even when the exact cause of the pain is unknown.Answering your caregiver's questions about your overall health and symptoms is one of the most accurate ways to make sure the cause of your pain is not dangerous. If your caregiver needs more information, he or she may order lab work or imaging tests (X-rays or MRIs).However, even if imaging tests show changes in your back, this usually does not require surgery. HOME CARE INSTRUCTIONS For many people, back pain returns.Since low back pain is rarely dangerous, it is often a condition that people can learn to [facility] their own.   Remain active. It is stressful on the back to sit or stand in one place. Do not sit, drive, or stand in one place for more than 30 minutes at a time. Take short walks on level surfaces as soon as pain allows.Try to increase the length of time you walk each day.  Do  not stay in bed.Resting more than 1 or 2 days can delay your recovery.  Do not avoid exercise or work.Your body is made to move.It is not dangerous to be active, even though your back may hurt.Your back will likely heal faster if you return to being active before your pain is gone.  Pay attention to your body when you bend and lift. Many people have less discomfortwhen lifting if they bend their knees, keep the load close to their bodies,and avoid twisting. Often, the most comfortable positions are those that put less stress on your recovering back.  Find a comfortable position to sleep. Use a firm mattress and lie on your side with your knees slightly bent. If you lie on your back, put a pillow  under your knees.  Only take over-the-counter or prescription medicines as directed by your caregiver. Over-the-counter medicines to reduce pain and inflammation are often the most helpful.Your caregiver may prescribe muscle relaxant drugs.These medicines help dull your pain so you can more quickly return to your normal activities and healthy exercise.  Put ice on the injured area.  Put ice in a plastic bag.  Place a towel between your skin and the bag.  Leave the ice on for 15-20 minutes, 03-04 times a day for the first 2 to 3 days. After that, ice and heat may be alternated to reduce pain and spasms.  Ask your caregiver about trying back exercises and gentle massage. This may be of some benefit.  Avoid feeling anxious or stressed.Stress increases muscle tension and can worsen back pain.It is important to recognize when you are anxious or stressed and learn ways to manage it.Exercise is a great option. SEEK MEDICAL CARE IF:  You have pain that is not relieved with rest or medicine.  You have pain that does not improve in 1 week.  You have new symptoms.  You are generally not feeling well. SEEK IMMEDIATE MEDICAL CARE IF:   You have pain that radiates from your back into your legs.  You develop new bowel or bladder control problems.  You have unusual weakness or numbness in your arms or legs.  You develop nausea or vomiting.  You develop abdominal pain.  You feel faint. Document Released: 07/10/2005 Document Revised: 01/09/2012 Document Reviewed: 11/11/2013 [facility] Patient Information 2015 [place]. This information is not intended to replace advice given to you by your health care provider. Make sure you discuss any questions you have with your health care provider.

## 2014-09-12 NOTE — ED Notes (Signed)
Pt concerned about her medicaid not being accepted.  States she never has a problem going to the ED and having medicaid paying for it.  Pt informed to consult with the registration people.  Pt asked if she has a PMD and states the one she was seeing isn't accepting medicaid.

## 2015-10-29 ENCOUNTER — Emergency Department (HOSPITAL_COMMUNITY): Payer: Self-pay

## 2015-10-29 ENCOUNTER — Encounter (HOSPITAL_COMMUNITY): Payer: Self-pay | Admitting: Emergency Medicine

## 2015-10-29 ENCOUNTER — Emergency Department (HOSPITAL_COMMUNITY)
Admission: EM | Admit: 2015-10-29 | Discharge: 2015-10-29 | Disposition: A | Discharge status: Home / Self Care | Payer: Self-pay | Attending: Emergency Medicine | Admitting: Emergency Medicine

## 2015-10-29 DIAGNOSIS — M25562 Pain in left knee: Secondary | ICD-10-CM | POA: Insufficient documentation

## 2015-10-29 DIAGNOSIS — R600 Localized edema: Secondary | ICD-10-CM | POA: Insufficient documentation

## 2015-10-29 NOTE — ED Notes (Signed)
Pt c/o left posterior knee pain and edema, tender to palpation x 1 week, onset after deconstructing furniture. No erythema. Negative Homan's sign. No posterior leg tenderness to palpation. Pt self-treated with epsom salt, some relief.

## 2015-10-29 NOTE — ED Notes (Signed)
PT did not answer to name being called 

## 2015-10-29 NOTE — ED Notes (Signed)
Pt has been called for acute room x3 no response

## 2015-10-31 ENCOUNTER — Emergency Department (HOSPITAL_COMMUNITY)
Admission: EM | Admit: 2015-10-31 | Discharge: 2015-10-31 | Disposition: A | Discharge status: Home / Self Care | Payer: Self-pay | Attending: Emergency Medicine | Admitting: Emergency Medicine

## 2015-10-31 ENCOUNTER — Encounter (HOSPITAL_COMMUNITY): Payer: Self-pay | Admitting: Emergency Medicine

## 2015-10-31 ENCOUNTER — Emergency Department (HOSPITAL_COMMUNITY): Payer: Self-pay

## 2015-10-31 DIAGNOSIS — E669 Obesity, unspecified: Secondary | ICD-10-CM | POA: Insufficient documentation

## 2015-10-31 DIAGNOSIS — M1712 Unilateral primary osteoarthritis, left knee: Secondary | ICD-10-CM | POA: Insufficient documentation

## 2015-10-31 MED ORDER — IBUPROFEN 600 MG PO TABS: 600.0000 mg | ORAL_TABLET | Freq: Four times a day (QID) | ORAL | Status: DC | PRN

## 2015-10-31 NOTE — ED Notes (Signed)
Pt taken to x-ray and returned to room without distress noted. 

## 2015-10-31 NOTE — Discharge Instructions (Signed)

## 2015-10-31 NOTE — ED Notes (Signed)
Awake. Verbally responsive. A/O x4. Resp even and unlabored. No audible adventitious breath sounds noted. ABC's intact.  

## 2015-10-31 NOTE — ED Notes (Signed)
Pt reported lt knee pain and swelling without injury/trauma. (+)PMS, CRT brisk, no obvious deformity/bruising, LROM, TWB status

## 2015-10-31 NOTE — ED Provider Notes (Signed)
CSN: [id number]     Arrival date & time 10/31/15  0931 History   First MD Initiated Contact with Patient 10/31/15 0940     Chief Complaint  Patient presents with  . Knee Pain     (Consider location/radiation/quality/duration/timing/severity/associated sxs/prior Treatment) Patient is a 45 y.o. female presenting with knee pain. The history is provided by the patient.  Knee Pain Location:  Knee Time since incident:  1 week Injury: no   Knee location:  L knee Pain details:    Quality:  Aching   Radiates to:  Does not radiate   Severity:  Mild   Onset quality:  Gradual   Timing:  Constant   Progression:  Unchanged Chronicity:  New Prior injury to area:  No Relieved by:  Nothing Worsened by:  Bearing weight and exercise Ineffective treatments:  None tried Associated symptoms: stiffness and swelling   Risk factors: obesity     History reviewed. No pertinent past medical history. Past Surgical History  Procedure Laterality Date  . Uterine fibroid surgery  1995  . Abdominal hysterectomy  2003  . Cesarean section    . Ankle surgery     Family History  Problem Relation Age of Onset  . Asthma Mother   . Hypertension Mother   . Diabetes Father   . Hypertension Father   . Healthy Sister   . Healthy Brother   . Healthy Brother   . Healthy Sister   . Healthy Sister   . Healthy Sister   . Healthy Sister   . Healthy Sister   . Lupus Sister    Social History  Substance Use Topics  . Smoking status: Never Smoker   . Smokeless tobacco: None  . Alcohol Use: No   OB History    No data available     Review of Systems  Musculoskeletal: Positive for stiffness.  All other systems reviewed and are negative.     Allergies  Review of patient's allergies indicates no known allergies.  Home Medications   Prior to Admission medications   Medication Sig Start Date End Date Taking? Authorizing Provider  ibuprofen (ADVIL,MOTRIN) 200 MG tablet Take 400 mg by mouth every 6  (six) hours as needed (knee pain).   Yes Historical Provider, MD   BP 123/74 mmHg  Pulse 93  Temp(Src) 98.6 F (37 C) (Oral)  Resp 18  Ht 5\' 2"  (1.575 m)  Wt 180 lb (81.647 kg)  BMI 32.91 kg/m2  SpO2 100% Physical Exam  Constitutional: She is oriented to person, place, and time. She appears well-developed and well-nourished. No distress.  HENT:  Head: Normocephalic.  Eyes: Conjunctivae are normal.  Neck: Neck supple. No tracheal deviation present.  Cardiovascular: Normal rate and regular rhythm.   Pulmonary/Chest: Effort normal. No respiratory distress.  Abdominal: Soft. She exhibits no distension.  Musculoskeletal:       Left knee: She exhibits swelling and effusion (small). She exhibits normal range of motion, no deformity, no LCL laxity, normal patellar mobility and no MCL laxity. No tenderness found.  Slight left knee crepitus, normal meniscus and ACL/PCL  Neurological: She is alert and oriented to person, place, and time.  Skin: Skin is warm and dry.  Psychiatric: She has a normal mood and affect.    ED Course  Procedures (including critical care time) Labs Review Labs Reviewed - No data to display  Imaging Review Dg Knee Complete 4 Views Left  10/31/2015  CLINICAL DATA:  45 year old with 2 week history of  left knee pain and swelling. No known injuries. EXAM: LEFT KNEE - COMPLETE 4+ VIEW COMPARISON:  None. FINDINGS: No evidence of acute or subacute fracture or dislocation. Well preserved joint spaces. Well preserved bone mineral density. Minimal spurring along the undersurface of the patella. Small joint effusion. IMPRESSION: Minimal degenerative changes involving the undersurface of the patella. No acute or subacute osseous abnormality. Small joint effusion. Electronically Signed   By: Hulan Saas M.D.   On: 10/31/2015 10:32   I have personally reviewed and evaluated these images and lab results as part of my medical decision-making.   EKG Interpretation None       MDM   Final diagnoses:  Localized osteoarthritis of left knee    45 y.o. female presents with Left knee pain and small effusion, discomfort with weightbearing that is concerning for osteoarthritis. Mild degenerative changes of patella that are consistent with this clinical finding without severely degenerated joint. I recommended scheduled NSAIDs, supportive care, strengthening exercises of the knee and weight loss as definitive therapy.    Lyndal Pulley, MD 10/31/15 1726

## 2016-01-14 ENCOUNTER — Encounter (HOSPITAL_COMMUNITY): Payer: Self-pay | Admitting: Emergency Medicine

## 2016-01-14 ENCOUNTER — Emergency Department (HOSPITAL_COMMUNITY)
Admission: EM | Admit: 2016-01-14 | Discharge: 2016-01-14 | Disposition: A | Discharge status: Home / Self Care | Payer: No Typology Code available for payment source | Attending: Emergency Medicine | Admitting: Emergency Medicine

## 2016-01-14 DIAGNOSIS — M795 Residual foreign body in soft tissue: Secondary | ICD-10-CM

## 2016-01-14 DIAGNOSIS — Y929 Unspecified place or not applicable: Secondary | ICD-10-CM | POA: Insufficient documentation

## 2016-01-14 DIAGNOSIS — X58XXXA Exposure to other specified factors, initial encounter: Secondary | ICD-10-CM | POA: Insufficient documentation

## 2016-01-14 DIAGNOSIS — Y939 Activity, unspecified: Secondary | ICD-10-CM | POA: Insufficient documentation

## 2016-01-14 DIAGNOSIS — T161XXA Foreign body in right ear, initial encounter: Secondary | ICD-10-CM | POA: Insufficient documentation

## 2016-01-14 DIAGNOSIS — Y999 Unspecified external cause status: Secondary | ICD-10-CM | POA: Insufficient documentation

## 2016-01-14 NOTE — ED Notes (Signed)
Pt states she woke up this morning and heard a buzzing noise in her right ear  Pt states her ear felt stopped up  Pt placed 2 drops of baby oil in her right ear and the buzzing quit but her ear continues to feels stopped up

## 2016-01-14 NOTE — ED Provider Notes (Signed)
CSN: [id number]     Arrival date & time 01/14/16  [id number] History   First MD Initiated Contact with Patient 01/14/16 [phone]     Chief Complaint  Patient presents with  . Foreign Body in Ear     (Consider location/radiation/quality/duration/timing/severity/associated sxs/prior Treatment) Patient is a 45 y.o. female presenting with foreign body in ear. The history is provided by the patient.  Foreign Body in Ear This is a new problem. The current episode started 1 to 2 hours ago. The problem occurs constantly. The problem has not changed since onset.Pertinent negatives include no chest pain. Nothing aggravates the symptoms. Nothing relieves the symptoms. Treatments tried: baby oil put baby oil in the right ear and buzzing ceased. The treatment provided no relief.    History reviewed. No pertinent past medical history. Past Surgical History  Procedure Laterality Date  . Uterine fibroid surgery  1995  . Abdominal hysterectomy  2003  . Cesarean section    . Ankle surgery     Family History  Problem Relation Age of Onset  . Asthma Mother   . Hypertension Mother   . Diabetes Father   . Hypertension Father   . Healthy Sister   . Healthy Brother   . Healthy Brother   . Healthy Sister   . Healthy Sister   . Healthy Sister   . Healthy Sister   . Healthy Sister   . Lupus Sister    Social History  Substance Use Topics  . Smoking status: Never Smoker   . Smokeless tobacco: None  . Alcohol Use: No   OB History    No data available     Review of Systems  Cardiovascular: Negative for chest pain.  All other systems reviewed and are negative.     Allergies  Review of patient's allergies indicates no known allergies.  Home Medications   Prior to Admission medications   Medication Sig Start Date End Date Taking? Authorizing Provider  ibuprofen (ADVIL,MOTRIN) 600 MG tablet Take 1 tablet (600 mg total) by mouth every 6 (six) hours as needed. 10/31/15   Lyndal Pulleyaniel Knott, MD   BP 132/89  mmHg  Pulse 90  Temp(Src) 98 F (36.7 C) (Oral)  Resp 18  SpO2 100% Physical Exam  Constitutional: She is oriented to person, place, and time. She appears well-developed and well-nourished. No distress.  HENT:  Head: Normocephalic and atraumatic.  Right Ear: External ear normal.  Left Ear: External ear normal.  Mouth/Throat: Oropharynx is clear and moist.  Small bug in the right ear prior to flush.  No perforation pre or post irrigation   Eyes: Conjunctivae are normal. Pupils are equal, round, and reactive to light.  Neck: Normal range of motion. Neck supple.  Cardiovascular: Normal rate, regular rhythm and intact distal pulses.   Pulmonary/Chest: Effort normal and breath sounds normal. No respiratory distress. She has no wheezes. She has no rales.  Abdominal: Soft. Bowel sounds are normal. There is no tenderness. There is no rebound and no guarding.  Musculoskeletal: Normal range of motion.  Neurological: She is alert and oriented to person, place, and time.  Skin: Skin is warm and dry.  Psychiatric: She has a normal mood and affect.    ED Course  Procedures (including critical care time) Labs Review Labs Reviewed - No data to display  Imaging Review No results found. I have personally reviewed and evaluated these images and lab results as part of my medical decision-making.   EKG Interpretation None  MDM   Final diagnoses:  Foreign body (FB) in soft tissue    Filed Vitals:   01/14/16 0530  BP: 132/89  Pulse: 90  Temp: 98 F (36.7 C)  Resp: 18    Irrigated with retrieval of big.      Stable for discharge at this time    Amarys Sliwinski, MD 01/14/16 [phone]

## 2016-05-28 ENCOUNTER — Encounter (HOSPITAL_COMMUNITY): Payer: Self-pay | Admitting: Emergency Medicine

## 2016-05-28 ENCOUNTER — Emergency Department (HOSPITAL_COMMUNITY)
Admission: EM | Admit: 2016-05-28 | Discharge: 2016-05-28 | Disposition: A | Discharge status: Home / Self Care | Payer: Medicaid Other | Attending: Emergency Medicine | Admitting: Emergency Medicine

## 2016-05-28 ENCOUNTER — Emergency Department (HOSPITAL_COMMUNITY): Payer: Medicaid Other

## 2016-05-28 DIAGNOSIS — Y939 Activity, unspecified: Secondary | ICD-10-CM | POA: Insufficient documentation

## 2016-05-28 DIAGNOSIS — S61209A Unspecified open wound of unspecified finger without damage to nail, initial encounter: Secondary | ICD-10-CM

## 2016-05-28 DIAGNOSIS — Y999 Unspecified external cause status: Secondary | ICD-10-CM | POA: Insufficient documentation

## 2016-05-28 DIAGNOSIS — W268XXA Contact with other sharp object(s), not elsewhere classified, initial encounter: Secondary | ICD-10-CM | POA: Insufficient documentation

## 2016-05-28 DIAGNOSIS — S61306A Unspecified open wound of right little finger with damage to nail, initial encounter: Secondary | ICD-10-CM | POA: Insufficient documentation

## 2016-05-28 DIAGNOSIS — Z23 Encounter for immunization: Secondary | ICD-10-CM | POA: Insufficient documentation

## 2016-05-28 DIAGNOSIS — Y92009 Unspecified place in unspecified non-institutional (private) residence as the place of occurrence of the external cause: Secondary | ICD-10-CM | POA: Insufficient documentation

## 2016-05-28 DIAGNOSIS — S61304A Unspecified open wound of right ring finger with damage to nail, initial encounter: Secondary | ICD-10-CM | POA: Insufficient documentation

## 2016-05-28 MED ORDER — TETANUS-DIPHTH-ACELL PERTUSSIS 5-2.5-18.5 LF-MCG/0.5 IM SUSP
0.5000 mL | Freq: Once | INTRAMUSCULAR | Status: AC
Start: 2016-05-28 — End: 2016-05-28
  Administered 2016-05-28: 0.5 mL via INTRAMUSCULAR
  Filled 2016-05-28: qty 0.5

## 2016-05-28 MED ORDER — ACETAMINOPHEN 325 MG PO TABS
650.0000 mg | ORAL_TABLET | Freq: Once | ORAL | Status: AC
  Administered 2016-05-28: 650 mg via ORAL
  Filled 2016-05-28: qty 2

## 2016-05-28 NOTE — ED Provider Notes (Signed)
[facility]-EMERGENCY DEPT Provider Note   CSN: [id number] Arrival date & time: 05/28/16  1226  By signing my name below, I, Rosario AdieWilliam Andrew Hiatt, attest that this documentation has been prepared under the direction and in the presence of Demetrios LollKenneth Biana Haggar, PA-C.  Electronically Signed: Rosario AdieWilliam Andrew Hiatt, ED Scribe. 05/28/16. 12:49 PM.  History   Chief Complaint Chief Complaint  Patient presents with  . Finger Injury   The history is provided by the patient. No language interpreter was used.    HPI Comments: Jody Solis is a 45 y.o. female who presents to the Emergency Department complaining of wounds sustained to the right fifth digit that occurred just PTA. Pt notes that the area was initially bleeding moderately, however is controlled w/ pressure dressing while in the ED. Pt reports that she was using a device with a sharp blade to cut cabbage at home, when the device came down onto her digits, sustaining her wounds. Pt applied a pressure dressing prior to coming into the ED, however, no other noted treatments were tried. Her pain to the area is exacerbated with palpation of the area. NKDA. She denies weakness, numbness, or any other associated symptoms. Tetanus is not UTD.   PCP: Evette GeorgesDD,JEFFREY ALLEN, MD  History reviewed. No pertinent past medical history.  Patient Active Problem List   Diagnosis Date Noted  . CANDIDIASIS, SKIN 11/04/2008  . [facility]   Past Surgical History:  Procedure Laterality Date  . ABDOMINAL HYSTERECTOMY  2003  . ANKLE SURGERY    . CESAREAN SECTION    . UTERINE FIBROID SURGERY  1995   OB History    No data available     Home Medications    Prior to Admission medications   Medication Sig Start Date End Date Taking? Authorizing Provider  ibuprofen (ADVIL,MOTRIN) 600 MG tablet Take 1 tablet (600 mg total) by mouth every 6 (six) hours as needed. 10/31/15   Lyndal Pulleyaniel Knott, MD   Family History Family History  Problem Relation Age of Onset  . Asthma  Mother   . Hypertension Mother   . Diabetes Father   . Hypertension Father   . Healthy Sister   . Healthy Brother   . Healthy Brother   . Healthy Sister   . Healthy Sister   . Healthy Sister   . Healthy Sister   . Healthy Sister   . Lupus Sister    Social History Social History  Substance Use Topics  . Smoking status: Never Smoker  . Smokeless tobacco: Not on file  . Alcohol use No   Allergies   Patient has no known allergies.  Review of Systems Review of Systems  Musculoskeletal: Positive for myalgias.  Skin: Positive for wound.  Neurological: Negative for weakness and numbness.  All other systems reviewed and are negative.  Physical Exam Updated Vital Signs BP 125/78   Pulse 91   Temp 98.4 F (36.9 C) (Oral)   Resp 16   SpO2 99%   Physical Exam  Constitutional: She appears well-developed and well-nourished.  HENT:  Head: Normocephalic.  Eyes: Conjunctivae are normal.  Cardiovascular: Normal rate.   Pulses:      Radial pulses are 2+ on the right side, and 2+ on the left side.  Pulmonary/Chest: Effort normal. No respiratory distress.  Abdominal: She exhibits no distension.  Musculoskeletal: Normal range of motion.       Right hand: She exhibits normal range of motion and normal capillary refill. Normal sensation noted. Normal strength noted.  Avulsions to the right fifth digit at the tip of the phalynx. The nail is partially removed. Nailbed is intact. Bleeding is controlled. nailplate is secured at the base of the nail. Sensation intact. Capillary refill normal. Radial pulses are 2+ bilaterally. Full ROM.  1mm avulsion to the 4th tip of pha lynx with bleeding controlled. No nail involvement.  Full ROM. Sensation intact. Cap refill normal.  Neurological: She is alert.  Skin: Skin is warm and dry. Capillary refill takes less than 2 seconds.  Psychiatric: She has a normal mood and affect. Her behavior is normal.  Nursing note and vitals reviewed.  ED  Treatments / Results  DIAGNOSTIC STUDIES: Oxygen Saturation is 99% on RA, normal by my interpretation.   COORDINATION OF CARE: 12:41 PM-Discussed next steps with pt. Pt verbalized understanding and is agreeable with the plan.   Radiology Dg Hand Complete Right  Result Date: 05/28/2016 CLINICAL DATA:  Recent knife accident involving the fourth and fifth digits. EXAM: RIGHT HAND - COMPLETE 3+ VIEW COMPARISON:  None. FINDINGS: No acute bony abnormality is seen. No definitive metallic foreign body is noted. No gross soft tissue abnormality is seen. IMPRESSION: No acute abnormality noted. Electronically Signed   By: Alcide CleverMark  Lukens M.D.   On: 05/28/2016 13:35   Procedures Procedures   Medications Ordered in ED Medications  Tdap (BOOSTRIX) injection 0.5 mL (0.5 mLs Intramuscular Given 05/28/16 1259)  acetaminophen (TYLENOL) tablet 650 mg (650 mg Oral Given 05/28/16 1354)   Initial Impression / Assessment and Plan / ED Course  I have reviewed the triage vital signs and the nursing notes.  Pertinent labs & imaging results that were available during my care of the patient were reviewed by me and considered in my medical decision making (see chart for details).  Clinical Course    12:45 PM Patient is a 45yo female that presents with avulsion to distal portions of the right fourth and fifth digits. Tdap booster given in the ED. Pressure irrigation performed. Bottom of the wound visualized with bleeding controled. Laceration occurred <8 hours prior to arrival. Due to nail involvement, will obtain DG right hand to evaluate for any bony abnormalities or possible FB. Due to MOI and presentation of superficial avulsion, I do not feel that laceration repair is warranted at this time.  1:48 PM  Patient right hand XR negative for obvious fracture, dislocation, or other abnormalities. Pain managed in ED. Pt has no co morbidities to effect normal wound healing. Discussed wound home care w/ pt and answered  questions. Will refer to orthopedics for f/u. Pt is comfortable with above plan and is stable for discharge at this time. All questions were answered prior to disposition. Strict return precautions for f/u into the ED were discussed.   Final Clinical Impressions(s) / ED Diagnoses   Final diagnoses:  Avulsion of finger tip, initial encounter   New Prescriptions New Prescriptions   No medications on file   I personally performed the services described in this documentation, which was scribed in my presence. The recorded information has been reviewed and is accurate.     Rise MuKenneth T Liliyana Thobe, PA-C 05/28/16 1357    Arby BarretteMarcy Pfeiffer, MD 06/01/16 1054

## 2016-05-28 NOTE — ED Triage Notes (Signed)
Pt cut tip of R pinky and grazed tip of R ring finger while using a mandolin to chop cabbage. Bleeding minimal in triage. Last tetanus shot date unknown.

## 2016-05-28 NOTE — Discharge Instructions (Signed)
Your x-ray was normal. Keep the wound clean with mild soap and water. Keep antibiotic ointment on it for the next 24-48 hours. Keep the dressing on for the next 24 hours. If he develops signs of infection including increased redness, increased swelling, worsening pain follow-up with her primary care doctor or return to the ED. You may take Tylenol and ibuprofen for the pain. Continue to ice, rest, elevate the hand.

## 2016-06-04 ENCOUNTER — Emergency Department (HOSPITAL_COMMUNITY)
Admission: EM | Admit: 2016-06-04 | Discharge: 2016-06-04 | Disposition: A | Discharge status: Home / Self Care | Payer: Medicaid Other | Attending: Emergency Medicine | Admitting: Emergency Medicine

## 2016-06-04 ENCOUNTER — Encounter (HOSPITAL_COMMUNITY): Payer: Self-pay | Admitting: Emergency Medicine

## 2016-06-04 DIAGNOSIS — Z4801 Encounter for change or removal of surgical wound dressing: Secondary | ICD-10-CM | POA: Insufficient documentation

## 2016-06-04 DIAGNOSIS — Z5189 Encounter for other specified aftercare: Secondary | ICD-10-CM

## 2016-06-04 NOTE — ED Provider Notes (Signed)
[facility]-EMERGENCY DEPT Provider Note   CSN: [id number] Arrival date & time: 06/04/16  1550  By signing my name below, I, Doreatha MartinEva Mathews, attest that this documentation has been prepared under the direction and in the presence of  Sharilyn SitesLisa Mousa Prout, PA-C. Electronically Signed: Doreatha MartinEva Mathews, ED Scribe. 06/04/16. 4:12 PM.    History   Chief Complaint Chief Complaint  Patient presents with  . Finger Injury    HPI Jody Solis is a 45 y.o. female who presents to the Emergency Department for reevaluation of a healing laceration to the distal right 5th digit that was sustained 7 days ago. Pt states she sustained the wound while she was cutting cabbage on a mandolin. Pt was seen 7 days ago in the ED for initial evaluation of the wound, had negative XR and was sent home with f/u for orthopedics. She states she continues to experience throbbing pain, but reports she is treating the wound with antibiotic ointment and regular dressing changes. She is not currently followed by a PCP d/t issues with Medicaid covering [place], who she previously saw. She denies additional complaints.   The history is provided by the patient. No language interpreter was used.    No past medical history on file.  Patient Active Problem List   Diagnosis Date Noted  . CANDIDIASIS, SKIN 11/04/2008  . [facility]    Past Surgical History:  Procedure Laterality Date  . ABDOMINAL HYSTERECTOMY  2003  . ANKLE SURGERY    . CESAREAN SECTION    . UTERINE FIBROID SURGERY  1995    OB History    No data available       Home Medications    Prior to Admission medications   Medication Sig Start Date End Date Taking? Authorizing Provider  ibuprofen (ADVIL,MOTRIN) 600 MG tablet Take 1 tablet (600 mg total) by mouth every 6 (six) hours as needed. 10/31/15   Lyndal Pulleyaniel Knott, MD    Family History Family History  Problem Relation Age of Onset  . Asthma Mother   . Hypertension Mother   . Diabetes Father   . Hypertension Father    . Healthy Sister   . Healthy Brother   . Healthy Brother   . Healthy Sister   . Healthy Sister   . Healthy Sister   . Healthy Sister   . Healthy Sister   . Lupus Sister     Social History Social History  Substance Use Topics  . Smoking status: Never Smoker  . Smokeless tobacco: Not on file  . Alcohol use No     Allergies   Patient has no known allergies.   Review of Systems Review of Systems  Skin: Positive for wound.  All other systems reviewed and are negative.    Physical Exam Updated Vital Signs BP 142/74 (BP Location: Right Arm)   Pulse 88   Temp 98 F (36.7 C) (Oral)   Resp 20   SpO2 99%   Physical Exam  Constitutional: She is oriented to person, place, and time. She appears well-developed and well-nourished.  HENT:  Head: Normocephalic and atraumatic.  Mouth/Throat: Oropharynx is clear and moist.  Eyes: Conjunctivae and EOM are normal. Pupils are equal, round, and reactive to light.  Neck: Normal range of motion.  Cardiovascular: Normal rate, regular rhythm and normal heart sounds.   Pulmonary/Chest: Effort normal and breath sounds normal.  Abdominal: Soft. Bowel sounds are normal.  Musculoskeletal: Normal range of motion.  Avulsion type laceration of distal right 5th digit;  involvement of lateral edge of nail, no disruption of nail bed/matrix noted; wound appears to be healing well, granulation tissue present; no purulent drainage or signs of infection  Neurological: She is alert and oriented to person, place, and time.  Skin: Skin is warm and dry.  Psychiatric: She has a normal mood and affect.  Nursing note and vitals reviewed.    ED Treatments / Results   DIAGNOSTIC STUDIES: Oxygen Saturation is 99% on RA, normal by my interpretation.    COORDINATION OF CARE: 4:10 PM Discussed treatment plan with pt at bedside which includes PCP resources and pt agreed to plan.    Labs (all labs ordered are listed, but only abnormal results are  displayed) Labs Reviewed - No data to display  EKG  EKG Interpretation None       Radiology No results found.  Procedures Procedures (including critical care time)  Medications Ordered in ED Medications - No data to display   Initial Impression / Assessment and Plan / ED Course  I have reviewed the triage vital signs and the nursing notes.  Pertinent labs & imaging results that were available during my care of the patient were reviewed by me and considered in my medical decision making (see chart for details).  Clinical Course    45 year old female here for wound check. She sustained an avulsion type laceration of the right fifth digit. Wound seems to be healing well without any signs of infection. She has healthy granulation tissue present. She still has a defect of the right lateral nail, however no apparent damage to the nailbed and matrix. Suspect this should heal normally in the near future. Recommend to continue home wound care and dressing changes. States her current PCP is no longer accepting Medicaid, she was given information for local offices which do except medicated in which to have further follow-up.  Discussed plan with patient, she acknowledged understanding and agreed with plan of care.  Return precautions given for new or worsening symptoms.  Final Clinical Impressions(s) / ED Diagnoses   Final diagnoses:  Visit for wound check    New Prescriptions Discharge Medication List as of 06/04/2016  4:20 PM      I personally performed the services described in this documentation, which was scribed in my presence. The recorded information has been reviewed and is accurate.    Garlon HatchetLisa M Zykeria Laguardia, PA-C 06/04/16 1655    Bethann BerkshireJoseph Zammit, MD 06/04/16 [phone]

## 2016-06-04 NOTE — ED Triage Notes (Signed)
Pt is here for a recheck on wound to Right pinky finger

## 2016-06-04 NOTE — Discharge Instructions (Signed)
Wound looks good today.  Keep clean and keep doing dressing changes at least once daily. Follow-up with new primary care doctor when you can. Return here for any new or worsening symptoms-- high fever, redness, swelling, drainage, etc.

## 2017-09-15 ENCOUNTER — Encounter (HOSPITAL_COMMUNITY): Payer: Self-pay | Admitting: Emergency Medicine

## 2017-09-15 ENCOUNTER — Emergency Department (HOSPITAL_COMMUNITY)
Admission: EM | Admit: 2017-09-15 | Discharge: 2017-09-15 | Disposition: A | Discharge status: Home / Self Care | Payer: Self-pay | Attending: Physician Assistant | Admitting: Physician Assistant

## 2017-09-15 DIAGNOSIS — R591 Generalized enlarged lymph nodes: Secondary | ICD-10-CM | POA: Insufficient documentation

## 2017-09-15 NOTE — ED Triage Notes (Signed)
Patient reports right side neck pain that is worse when she swallows and when she palpates right side of neck.  Reports pain will resolve when take ibuprofen but will return when wears off.

## 2017-09-15 NOTE — ED Provider Notes (Signed)
[facility]-EMERGENCY DEPT Provider Note   CSN: [id number] Arrival date & time: 09/15/17  [id number]     History   Chief Complaint Chief Complaint  Patient presents with  . Neck Pain    right     HPI Jody Solis is a 47 y.o. female.  HPI   47 year old female presenting with pain on the right side of her neck and swelling.  This been going on for couple days.  Made better by ibuprofen.  Patient has not noted any high fevers.  No nausea no vomiting.  No difficulty handling secretions.  History reviewed. No pertinent past medical history.  Patient Active Problem List   Diagnosis Date Noted  . CANDIDIASIS, SKIN 11/04/2008  [facility]    Past Surgical History:  Procedure Laterality Date  . ABDOMINAL HYSTERECTOMY  2003  . ANKLE SURGERY    . CESAREAN SECTION    . UTERINE FIBROID SURGERY  1995    OB History    No data available       Home Medications    Prior to Admission medications   Medication Sig Start Date End Date Taking? Authorizing Provider  ibuprofen (ADVIL,MOTRIN) 600 MG tablet Take 1 tablet (600 mg total) by mouth every 6 (six) hours as needed. 10/31/15   Lyndal PulleyKnott, Daniel, MD    Family History Family History  Problem Relation Age of Onset  . Healthy Sister   . Healthy Brother   . Healthy Brother   . Healthy Sister   . Healthy Sister   . Healthy Sister   . Healthy Sister   . Healthy Sister   . Lupus Sister   . Asthma Mother   . Hypertension Mother   . Diabetes Father   . Hypertension Father     Social History Social History   Tobacco Use  . Smoking status: Never Smoker  . Smokeless tobacco: Never Used  Substance Use Topics  . Alcohol use: No  . Drug use: No     Allergies   Patient has no known allergies.   Review of Systems Review of Systems  Constitutional: Negative for activity change.  HENT: Positive for congestion, sinus pressure, sinus pain and sore throat. Negative for facial swelling, trouble  swallowing and voice change.   Respiratory: Negative for shortness of breath.   Cardiovascular: Negative for chest pain.  Gastrointestinal: Negative for abdominal pain.  All other systems reviewed and are negative.    Physical Exam Updated Vital Signs BP 136/78   Pulse 82   Temp 98.1 F (36.7 C) (Oral)   Resp 16   SpO2 100%   Physical Exam  Constitutional: She is oriented to person, place, and time. She appears well-developed and well-nourished.  HENT:  Head: Normocephalic and atraumatic.  Right Ear: External ear normal.  Left Ear: External ear normal.  Mouth/Throat: Oropharynx is clear and moist. No oropharyngeal exudate.  R and L TM nomral  Eyes: EOM are normal. Pupils are equal, round, and reactive to light. Right eye exhibits no discharge. Left eye exhibits no discharge.  Neck: Normal range of motion. Neck supple. No JVD present. No tracheal deviation present. No thyromegaly present.  Tenderness underneath the right jaw submandibular lymph node chain location, no swelling found  Cardiovascular: Normal rate, regular rhythm and normal heart sounds.  No murmur heard. Pulmonary/Chest: Effort normal and breath sounds normal. She has no wheezes. She has no rales.  Abdominal: Soft. She exhibits no distension. There is no tenderness.  Lymphadenopathy:    She has no cervical adenopathy.  Neurological: She is oriented to person, place, and time.  Skin: Skin is warm and dry. She is not diaphoretic.  Psychiatric: She has a normal mood and affect.  Nursing note and vitals reviewed.    ED Treatments / Results  Labs (all labs ordered are listed, but only abnormal results are displayed) Labs Reviewed - No data to display  EKG  EKG Interpretation None       Radiology No results found.  Procedures Procedures (including critical care time)  Medications Ordered in ED Medications - No data to display   Initial Impression / Assessment and Plan / ED Course  I have  reviewed the triage vital signs and the nursing notes.  Pertinent labs & imaging results that were available during my care of the patient were reviewed by me and considered in my medical decision making (see chart for details).    47 year old female presenting with pain on the right side of her neck and swelling.  This been going on for couple days.  Made better by ibuprofen.  Patient has not noted any high fevers.  No nausea no vomiting.  No difficulty handling secretions.  9:26 AM Suspect viral infection causing potential lymph no adenopathy.  Patient appears very well nontoxic normal vital signs and normal physical exam including no erythema to the ears throat or nose.  We will have her continue to use ibuprofen and follow-up with primary care as needed.  Do not believe antibiotics are indicated at this time.  Final Clinical Impressions(s) / ED Diagnoses   Final diagnoses:  Lymphadenopathy    ED Discharge Orders    None       Abelino Derrick, MD 09/15/17 [phone]

## 2017-09-15 NOTE — Discharge Instructions (Signed)
We do not see any signs of infection currently.  Please keep a close monitoring of symptoms and return to your primary care physician at this pain and nasal congestion occurs for longer than 10 days.

## 2020-05-11 ENCOUNTER — Ambulatory Visit
Admission: EM | Admit: 2020-05-11 | Discharge: 2020-05-11 | Discharge status: Against Medical Advice | Payer: Medicaid Other

## 2020-06-27 ENCOUNTER — Other Ambulatory Visit: Payer: Self-pay

## 2020-06-27 ENCOUNTER — Emergency Department (HOSPITAL_COMMUNITY)
Admission: EM | Admit: 2020-06-27 | Discharge: 2020-06-27 | Disposition: A | Discharge status: Home / Self Care | Payer: Self-pay | Attending: Emergency Medicine | Admitting: Emergency Medicine

## 2020-06-27 DIAGNOSIS — Z79899 Other long term (current) drug therapy: Secondary | ICD-10-CM | POA: Insufficient documentation

## 2020-06-27 DIAGNOSIS — I160 Hypertensive urgency: Secondary | ICD-10-CM | POA: Insufficient documentation

## 2020-06-27 LAB — CBC
HCT: 41.8 % (ref 36.0–46.0)
Hemoglobin: 13.6 g/dL (ref 12.0–15.0)
MCH: 29.4 pg (ref 26.0–34.0)
MCHC: 32.5 g/dL (ref 30.0–36.0)
MCV: 90.3 fL (ref 80.0–100.0)
Platelets: 309 10*3/uL (ref 150–400)
RBC: 4.63 MIL/uL (ref 3.87–5.11)
RDW: 12.6 % (ref 11.5–15.5)
WBC: 6.7 10*3/uL (ref 4.0–10.5)
nRBC: 0 % (ref 0.0–0.2)

## 2020-06-27 LAB — BASIC METABOLIC PANEL
Anion gap: 11 (ref 5–15)
BUN: 12 mg/dL (ref 6–20)
CO2: 24 mmol/L (ref 22–32)
Calcium: 9.6 mg/dL (ref 8.9–10.3)
Chloride: 103 mmol/L (ref 98–111)
Creatinine, Ser: 0.66 mg/dL (ref 0.44–1.00)
GFR, Estimated: >60 mL/min (ref >60)
Glucose, Bld: 95 mg/dL (ref 70–99)
Potassium: 3.6 mmol/L (ref 3.5–5.1)
Sodium: 138 mmol/L (ref 135–145)

## 2020-06-27 LAB — URINALYSIS, ROUTINE W REFLEX MICROSCOPIC
Bilirubin Urine: NEGATIVE
Glucose, UA: NEGATIVE mg/dL
Hgb urine dipstick: NEGATIVE
Ketones, ur: NEGATIVE mg/dL
Leukocytes,Ua: NEGATIVE
Nitrite: NEGATIVE
Protein, ur: NEGATIVE mg/dL
Specific Gravity, Urine: 1.005 (ref 1.005–1.030)
pH: 5 (ref 5.0–8.0)

## 2020-06-27 MED ORDER — HYDROCHLOROTHIAZIDE 12.5 MG PO TABS: 12.5000 mg | ORAL_TABLET | Freq: Every day | ORAL | 0 refills | Status: DC

## 2020-06-27 NOTE — ED Triage Notes (Signed)
Patient reports she has been having intervals of dizziness. Patient reports she went to have her BP checked at [facility] and it was noted to be elevated so she went to [facility] and her BP was more elevated than before. Patient concerned because she has no hx of HTN.

## 2020-06-27 NOTE — Discharge Instructions (Signed)
As discussed, your evaluation today has been largely reassuring.  But, it is important that you monitor your condition carefully, and do not hesitate to return to the ED if you develop new, or concerning changes in your condition.  You are starting a new medication.  Please take this as directed.  Otherwise, please follow-up with our physicians for appropriate ongoing care.

## 2020-06-27 NOTE — ED Provider Notes (Signed)
[facility]-EMERGENCY DEPT Provider Note   CSN: [id number] Arrival date & time: 06/27/20  1721     History Chief Complaint  Patient presents with  . Dizziness  . Hypertension    Jody Solis is a 49 y.o. female.  HPI Patient presents with several transient complaints, but larger concern of elevated blood pressure. She notes that she has been doing generally well, but over the past days she has had episodes of dizziness.  With this concern she checked her blood pressure, found to be elevated.  Repeat evaluation and similar values and she was sent here for evaluation.  Line patient denies medical problems, including hypertension.  No recent change in medication, diet, activity.  She does note that she has had multiple life stressors in the past year, including the death of her father. No chest pain, no abdominal pain, no pain at all currently.    No past medical history on file.  Patient Active Problem List   Diagnosis Date Noted  . CANDIDIASIS, SKIN 11/04/2008  . [facility] 08/09/2007    Past Surgical History:  Procedure Laterality Date  . ABDOMINAL HYSTERECTOMY  2003  . ANKLE SURGERY    . CESAREAN SECTION    . UTERINE FIBROID SURGERY  1995     OB History   No obstetric history on file.     Family History  Problem Relation Age of Onset  . Healthy Sister   . Healthy Brother   . Healthy Brother   . Healthy Sister   . Healthy Sister   . Healthy Sister   . Healthy Sister   . Healthy Sister   . Lupus Sister   . Asthma Mother   . Hypertension Mother   . Diabetes Father   . Hypertension Father     Social History   Tobacco Use  . Smoking status: Never Smoker  . Smokeless tobacco: Never Used  Vaping Use  . Vaping Use: Never used  Substance Use Topics  . Alcohol use: No  . Drug use: No    Home Medications Prior to Admission medications   Medication Sig Start Date End Date Taking? Authorizing Provider  hydrochlorothiazide (HYDRODIURIL)  12.5 MG tablet Take 1 tablet (12.5 mg total) by mouth daily. 06/27/20 07/27/20  Gerhard Munch, MD  ibuprofen (ADVIL,MOTRIN) 600 MG tablet Take 1 tablet (600 mg total) by mouth every 6 (six) hours as needed. 10/31/15   Lyndal Pulley, MD    Allergies    Patient has no known allergies.  Review of Systems   Review of Systems  Constitutional:       Per HPI, otherwise negative  HENT:       Per HPI, otherwise negative  Respiratory:       Per HPI, otherwise negative  Cardiovascular:       Per HPI, otherwise negative  Gastrointestinal: Negative for vomiting.  Endocrine:       Negative aside from HPI  Genitourinary:       Neg aside from HPI   Musculoskeletal:       Per HPI, otherwise negative  Skin: Negative.   Neurological: Positive for dizziness. Negative for syncope.    Physical Exam Updated Vital Signs BP (!) 130/92   Pulse 87   Temp 98.7 F (37.1 C) (Oral)   Resp 19   SpO2 99%   Physical Exam Vitals and nursing note reviewed.  Constitutional:      General: She is not in acute distress.  Appearance: She is well-developed.  HENT:     Head: Normocephalic and atraumatic.  Eyes:     Conjunctiva/sclera: Conjunctivae normal.  Cardiovascular:     Rate and Rhythm: Normal rate and regular rhythm.  Pulmonary:     Effort: Pulmonary effort is normal. No respiratory distress.     Breath sounds: Normal breath sounds. No stridor.  Abdominal:     General: There is no distension.  Skin:    General: Skin is warm and dry.  Neurological:     Mental Status: She is alert and oriented to person, place, and time.     Cranial Nerves: No cranial nerve deficit.      ED Results / Procedures / Treatments   Labs (all labs ordered are listed, but only abnormal results are displayed) Labs Reviewed  URINALYSIS, ROUTINE W REFLEX MICROSCOPIC - Abnormal; Notable for the following components:      Result Value   Color, Urine STRAW (*)    All other components within normal limits  BASIC  METABOLIC PANEL  CBC    EKG EKG Interpretation  Date/Time:  "Sunday June 27 2020 20:23:30 EST Ventricular Rate:  101 PR Interval:    QRS Duration: 66 QT Interval:  396 QTC Calculation: 514 R Axis:   32 Text Interpretation: Sinus tachycardia Low voltage, precordial leads Borderline T abnormalities, anterior leads Prolonged QT interval Baseline wander in lead(s) II V3 V5 12 Lead; Mason-Likar Abnormal ECG Confirmed by Tyshaun Vinzant ([id number]) on 06/27/2020 9:30:24 PM    Procedures Procedures (including critical care time)  Medications Ordered in ED Medications - No data to display  ED Course  I have reviewed the triage vital signs and the nursing notes.  Pertinent labs & imaging results that were available during my care of the patient were reviewed by me and considered in my medical decision making (see chart for details).  9:49 PM Patient in no distress, awake, alert, no new complaints.  We discussed today's findings, no evidence for endorgan damage from hypertension.  On she has remained persistently hypertensive. Given her absence of history of this, patient started on a low-dose antihypertensive, will follow up with primary care. MDM Number of Diagnoses or Management Options Hypertensive urgency: new, needed workup   Amount and/or Complexity of Data Reviewed Clinical lab tests: reviewed Tests in the medicine section of CPT: reviewed Decide to obtain previous medical records or to obtain history from someone other than the patient: yes Review and summarize past medical records: yes Independent visualization of images, tracings, or specimens: yes  Risk of Complications, Morbidity, and/or Mortality Presenting problems: high Diagnostic procedures: high Management options: high  Critical Care Total time providing critical care: < 30 minutes  Patient Progress Patient progress: stable   Final Clinical Impression(s) / ED Diagnoses Final diagnoses:  Hypertensive  urgency    Rx / DC Orders ED Discharge Orders         Ordered    hydrochlorothiazide (HYDRODIURIL) 12.5 MG tablet  Daily        12" /05/21 2146           2147, MD 06/27/20 2150

## 2020-06-28 ENCOUNTER — Ambulatory Visit: Payer: Self-pay

## 2020-06-28 NOTE — Telephone Encounter (Signed)
Phone call from pt.  Stated she was in the ER last evening, and did not get any information on her lab results or EKG.  Advised pt. That her UA, CBC, and BMP were all normal.  Advised that EKG showed Sinus Tachycardia with HR 101.  Pt. Stated she was in the ER for a long time, and got anxious while she was there.  Advised pt. It was recommended to follow-up with her PCP.  Stated she does not have a PCP.  Offered to sched. An appt. For her to establish care with a PCP.  Stated she is at work, and not able to stay on the phone any longer.   Was given phone number for [facility] by the ER.  Stated she will call back to schedule appt.  Pt. Denied any further needs at this time    Reason for Disposition . Health Information question, no triage required and triager able to answer question  Answer Assessment - Initial Assessment Questions 1. REASON FOR CALL or QUESTION: "What is your reason for calling today?" or "How can I best help you?" or "What question do you have that I can help answer?"     "I was in the ER last night and did not get any information on my EKG or lab results."  Protocols used: INFORMATION ONLY CALL - NO TRIAGE-A-AH

## 2021-01-11 ENCOUNTER — Other Ambulatory Visit: Payer: Self-pay

## 2021-01-11 ENCOUNTER — Ambulatory Visit (HOSPITAL_COMMUNITY)
Admission: EM | Admit: 2021-01-11 | Discharge: 2021-01-11 | Disposition: A | Discharge status: Home / Self Care | Payer: Self-pay | Attending: Emergency Medicine | Admitting: Emergency Medicine

## 2021-01-11 ENCOUNTER — Encounter (HOSPITAL_COMMUNITY): Payer: Self-pay | Admitting: Emergency Medicine

## 2021-01-11 DIAGNOSIS — M25511 Pain in right shoulder: Secondary | ICD-10-CM

## 2021-01-11 MED ORDER — IBUPROFEN 600 MG PO TABS: 600.0000 mg | ORAL_TABLET | Freq: Four times a day (QID) | ORAL | 0 refills | Status: DC | PRN

## 2021-01-11 NOTE — Discharge Instructions (Addendum)
Heating pad 15 minute intervals   Can use ibuprofen 600 mg three times a day for the next 5 days then as needed  Establish care at the health department to get physical and assessed for anxiety

## 2021-01-11 NOTE — ED Triage Notes (Signed)
Right arm pain for a couple of months.  Pain radiates down right arm from shoulder, 2 weeks ago, little finger started to feel numb, intermittently.  Patient is right handed.    Also complains of heart rate intermittently racing x one month.  Gets a warm flush feeling prior to feeling palpitationsNo pain with episodes, just feels odd.  Patient has notified EMS in the past and the offered reassurance.  At the time patient had been taking sudafed and told her to stop this medicine.

## 2021-01-13 NOTE — ED Provider Notes (Signed)
[facility]    CSN: [id number] Arrival date & time: 01/11/21  1225      History   Chief Complaint Chief Complaint  Patient presents with   Arm Pain    HPI Jody Solis is a 50 y.o. female.   Patient presents with right shoulder pain radiating into right upper arm present for 3-4 months. Over the last month has begun to feel numbness and tingling in right pinky. Can feel pulling sensation with arm is behind back, able to lift arm over head and fully extend forward. Denies injury or trauma. Can not recall precipitating event.   Concerned with intermittent racing heart beat occurring at least once for the past 3-4 months. Over the last month has occurred 2-3 times. Last episode this morning. Feels warm and flushed and heart begins to go fast. Improves within 10 minutes with rest and drinking cool water. Denies trigger. Denies chest pain, chest tightness, shortness of breath, visual changes, speech changes, palpitations, weakness, dizziness and lightheadedness. Thinks it could be anxiety related but unsure why. Does not endorse high levels of stress.    History reviewed. No pertinent past medical history.  Patient Active Problem List   Diagnosis Date Noted   CANDIDIASIS, SKIN 11/04/2008   [facility] 08/09/2007    Past Surgical History:  Procedure Laterality Date   ABDOMINAL HYSTERECTOMY  2003   ANKLE SURGERY     CESAREAN SECTION     UTERINE FIBROID SURGERY  1995    OB History   No obstetric history on file.      Home Medications    Prior to Admission medications   Medication Sig Start Date End Date Taking? Authorizing Provider  hydrochlorothiazide (HYDRODIURIL) 12.5 MG tablet Take 1 tablet (12.5 mg total) by mouth daily. 06/27/20 07/27/20  Gerhard Munch, MD  ibuprofen (ADVIL) 600 MG tablet Take 1 tablet (600 mg total) by mouth every 6 (six) hours as needed. 01/11/21   Valinda Hoar, NP    Family History Family History  Problem Relation Age of Onset    Healthy Sister    Healthy Brother    Healthy Brother    Healthy Sister    Healthy Sister    Healthy Sister    Healthy Sister    Healthy Sister    Lupus Sister    Asthma Mother    Hypertension Mother    Diabetes Father    Hypertension Father     Social History Social History   Tobacco Use   Smoking status: Never   Smokeless tobacco: Never  Vaping Use   Vaping Use: Never used  Substance Use Topics   Alcohol use: No   Drug use: No     Allergies   Patient has no known allergies.   Review of Systems Review of Systems Defer to HPI    Physical Exam Triage Vital Signs ED Triage Vitals  Enc Vitals Group     BP 01/11/21 1317 132/85     Pulse Rate 01/11/21 1317 83     Resp 01/11/21 1317 20     Temp 01/11/21 1317 99 F (37.2 C)     Temp Source 01/11/21 1317 Oral     SpO2 01/11/21 1317 100 %     Weight --      Height --      Head Circumference --      Peak Flow --      Pain Score 01/11/21 1310 0     Pain Loc --  Pain Edu? --      Excl. in GC? --    No data found.  Updated Vital Signs BP 132/85 (BP Location: Right Arm)   Pulse 83   Temp 99 F (37.2 C) (Oral)   Resp 20   SpO2 100%   Visual Acuity Right Eye Distance:   Left Eye Distance:   Bilateral Distance:    Right Eye Near:   Left Eye Near:    Bilateral Near:     Physical Exam Constitutional:      Appearance: Normal appearance. She is normal weight.  HENT:     Head: Normocephalic.  Eyes:     Extraocular Movements: Extraocular movements intact.  Cardiovascular:     Rate and Rhythm: Normal rate and regular rhythm.     Pulses: Normal pulses.     Heart sounds: Normal heart sounds.  Pulmonary:     Effort: Pulmonary effort is normal.     Breath sounds: Normal breath sounds.  Musculoskeletal:     Comments: Tenderness over right trapezius muscle, no swelling noted, ROM intact. 2+ brachial pulse and carotid pulse  Skin:    General: Skin is warm and dry.  Neurological:     Mental Status:  She is alert and oriented to person, place, and time. Mental status is at baseline.  Psychiatric:        Mood and Affect: Mood normal.        Behavior: Behavior normal.     UC Treatments / Results  Labs (all labs ordered are listed, but only abnormal results are displayed) Labs Reviewed - No data to display  EKG   Radiology No results found.  Procedures Procedures (including critical care time)  Medications Ordered in UC Medications - No data to display  Initial Impression / Assessment and Plan / UC Course  I have reviewed the triage vital signs and the nursing notes.  Pertinent labs & imaging results that were available during my care of the patient were reviewed by me and considered in my medical decision making (see chart for details).  Acute pain of right shoulder  Ibuprofen 600 mg every 6 hours prn Vital signs stable, not currently having episode of racing heart, cardiac, respiratory and neurological unremarkable, PCP referral placed for follow up of racing heart, advised to try [facility] health department for primary care, no insurance  Final Clinical Impressions(s) / UC Diagnoses   Final diagnoses:  Acute pain of right shoulder     Discharge Instructions      Heating pad 15 minute intervals   Can use ibuprofen 600 mg three times a day for the next 5 days then as needed  Establish care at the health department to get physical and assessed for anxiety    ED Prescriptions     Medication Sig Dispense Auth. Provider   ibuprofen (ADVIL) 600 MG tablet  (Status: Discontinued) Take 1 tablet (600 mg total) by mouth every 6 (six) hours as needed. 60 tablet Shaniquia Brafford R, NP   ibuprofen (ADVIL) 600 MG tablet Take 1 tablet (600 mg total) by mouth every 6 (six) hours as needed. 60 tablet Dyllin Gulley, Elita Boone, NP      PDMP not reviewed this encounter.   Valinda Hoar, [place] 01/13/21 [phone]

## 2021-03-13 ENCOUNTER — Emergency Department (HOSPITAL_COMMUNITY): Payer: Medicaid Other

## 2021-03-13 ENCOUNTER — Other Ambulatory Visit: Payer: Self-pay

## 2021-03-13 ENCOUNTER — Emergency Department (HOSPITAL_COMMUNITY)
Admission: EM | Admit: 2021-03-13 | Discharge: 2021-03-13 | Disposition: A | Discharge status: Home / Self Care | Payer: Medicaid Other | Attending: Emergency Medicine | Admitting: Emergency Medicine

## 2021-03-13 ENCOUNTER — Encounter (HOSPITAL_COMMUNITY): Payer: Self-pay | Admitting: Oncology

## 2021-03-13 DIAGNOSIS — R519 Headache, unspecified: Secondary | ICD-10-CM | POA: Insufficient documentation

## 2021-03-13 DIAGNOSIS — I10 Essential (primary) hypertension: Secondary | ICD-10-CM | POA: Insufficient documentation

## 2021-03-13 DIAGNOSIS — R002 Palpitations: Secondary | ICD-10-CM

## 2021-03-13 LAB — CBC WITH DIFFERENTIAL/PLATELET
Abs Immature Granulocytes: 0.01 10*3/uL (ref 0.00–0.07)
Basophils Absolute: 0 10*3/uL (ref 0.0–0.1)
Basophils Relative: 0 %
Eosinophils Absolute: 0.1 10*3/uL (ref 0.0–0.5)
Eosinophils Relative: 1 %
HCT: 45 % (ref 36.0–46.0)
Hemoglobin: 13.9 g/dL (ref 12.0–15.0)
Immature Granulocytes: 0 %
Lymphocytes Relative: 33 %
Lymphs Abs: 1.7 10*3/uL (ref 0.7–4.0)
MCH: 29.6 pg (ref 26.0–34.0)
MCHC: 30.9 g/dL (ref 30.0–36.0)
MCV: 95.7 fL (ref 80.0–100.0)
Monocytes Absolute: 0.4 10*3/uL (ref 0.1–1.0)
Monocytes Relative: 7 %
Neutro Abs: 3 10*3/uL (ref 1.7–7.7)
Neutrophils Relative %: 59 %
Platelets: 317 10*3/uL (ref 150–400)
RBC: 4.7 MIL/uL (ref 3.87–5.11)
RDW: 12.9 % (ref 11.5–15.5)
WBC: 5.2 10*3/uL (ref 4.0–10.5)
nRBC: 0 % (ref 0.0–0.2)

## 2021-03-13 LAB — BASIC METABOLIC PANEL
Anion gap: 9 (ref 5–15)
BUN: 10 mg/dL (ref 6–20)
CO2: 23 mmol/L (ref 22–32)
Calcium: 9.2 mg/dL (ref 8.9–10.3)
Chloride: 105 mmol/L (ref 98–111)
Creatinine, Ser: 0.62 mg/dL (ref 0.44–1.00)
GFR, Estimated: >60 mL/min (ref >60)
Glucose, Bld: 104 mg/dL — ABNORMAL HIGH (ref 70–99)
Potassium: 3.5 mmol/L (ref 3.5–5.1)
Sodium: 137 mmol/L (ref 135–145)

## 2021-03-13 NOTE — Discharge Instructions (Addendum)
Your EKG, head CT scan, and labs are reassuring.  No concerning features noted on today's exam.  Follow-up with your primary care doctor for further care.  Return if your symptoms worsen or if you have any other concern.

## 2021-03-13 NOTE — ED Triage Notes (Signed)
Pt bib [facility] from home d/t HTN, anxiety, HA.  Pt has multiple life stressors. No dx of HTN or anxiety.

## 2021-03-13 NOTE — ED Provider Notes (Signed)
[facility]-EMERGENCY DEPT Provider Note   CSN: [id number] Arrival date & time: 03/13/21  1126     History Chief Complaint  Patient presents with   Hypertension    Jody Solis is a 50 y.o. female.  The history is provided by the patient and medical records. No language interpreter was used.  Hypertension   49 year old female who presents for evaluation of heart palpitation.  Patient reports that this morning while sitting watching TV she felt her heart racing, felt a bit anxious, and also endorsed having some headache.  She mention symptoms lasting for few minutes but she reach out and contact EMS.  When arrived, patient report EMS noted that her blood pressure was in the 200s systolic and recommend patient to come to ER for further evaluation.  Patient denies having history of high blood pressure.  She does admits to having increasing stress after attending a funeral of a family member yesterday and also report drops very stressful.  She also admits to drinking coffee this morning and eating her usual breakfast but states that this is not new.  She denies any history of thyroid disease, no history of PE or DVT no recent surgery prolonged bedrest active cancer leg swelling or calf tenderness.  She is not on oral hormone.  She denies any history of thyroid disease.  At this time she is back to her baseline.  Patient denies having vision changes, neck pain, fever chills chest pain shortness of breath abdominal pain back pain focal numbness focal weakness.   History reviewed. No pertinent past medical history.  Patient Active Problem List   Diagnosis Date Noted   CANDIDIASIS, SKIN 11/04/2008   [facility] 08/09/2007    Past Surgical History:  Procedure Laterality Date   ABDOMINAL HYSTERECTOMY  2003   ANKLE SURGERY     CESAREAN SECTION     UTERINE FIBROID SURGERY  1995     OB History   No obstetric history on file.     Family History  Problem Relation Age of  Onset   Healthy Sister    Healthy Brother    Healthy Brother    Healthy Sister    Healthy Sister    Healthy Sister    Healthy Sister    Healthy Sister    Lupus Sister    Asthma Mother    Hypertension Mother    Diabetes Father    Hypertension Father     Social History   Tobacco Use   Smoking status: Never   Smokeless tobacco: Never  Vaping Use   Vaping Use: Never used  Substance Use Topics   Alcohol use: No   Drug use: No    Home Medications Prior to Admission medications   Medication Sig Start Date End Date Taking? Authorizing Provider  hydrochlorothiazide (HYDRODIURIL) 12.5 MG tablet Take 1 tablet (12.5 mg total) by mouth daily. 06/27/20 07/27/20  Gerhard Munch, MD  ibuprofen (ADVIL) 600 MG tablet Take 1 tablet (600 mg total) by mouth every 6 (six) hours as needed. 01/11/21   Valinda Hoar, NP    Allergies    Patient has no known allergies.  Review of Systems   Review of Systems  All other systems reviewed and are negative.  Physical Exam Updated Vital Signs BP 129/81 (BP Location: Left Arm)   Pulse 82   Temp 98.2 F (36.8 C) (Oral)   Resp 16   Ht 5\' 1"  (1.549 m)   Wt 96.2 kg  SpO2 98%   BMI 40.06 kg/m   Physical Exam Vitals and nursing note reviewed.  Constitutional:      General: She is not in acute distress.    Appearance: She is well-developed.  HENT:     Head: Normocephalic and atraumatic.     Mouth/Throat:     Mouth: Mucous membranes are moist.  Eyes:     Extraocular Movements: Extraocular movements intact.     Conjunctiva/sclera: Conjunctivae normal.     Pupils: Pupils are equal, round, and reactive to light.  Cardiovascular:     Rate and Rhythm: Normal rate and regular rhythm.     Pulses: Normal pulses.     Heart sounds: Normal heart sounds.  Pulmonary:     Effort: Pulmonary effort is normal.  Abdominal:     Palpations: Abdomen is soft.     Tenderness: There is no abdominal tenderness.  Musculoskeletal:     Cervical back:  Normal range of motion and neck supple. No rigidity.  Skin:    Findings: No rash.  Neurological:     Mental Status: She is alert.     GCS: GCS eye subscore is 4. GCS verbal subscore is 5. GCS motor subscore is 6.     Cranial Nerves: Cranial nerves are intact.     Sensory: Sensation is intact.     Motor: Motor function is intact.  Psychiatric:        Attention and Perception: Attention normal.        Mood and Affect: Mood normal.    ED Results / Procedures / Treatments   Labs (all labs ordered are listed, but only abnormal results are displayed) Labs Reviewed  BASIC METABOLIC PANEL - Abnormal; Notable for the following components:      Result Value   Glucose, Bld 104 (*)    All other components within normal limits  CBC WITH DIFFERENTIAL/PLATELET    EKG EKG Interpretation  Date/Time:  Sunday March 13 2021 11:39:19 EDT Ventricular Rate:  86 PR Interval:  160 QRS Duration: 88 QT Interval:  350 QTC Calculation: 419 R Axis:   22 Text Interpretation: Sinus rhythm Low voltage, precordial leads 12 Lead; Mason-Likar No STEMI Confirmed by Ernie Avena ([id number]) on 03/13/2021 12:52:26 PM  Radiology CT HEAD WO CONTRAST ( )  Result Date: 03/13/2021 CLINICAL DATA:  Headache, hypertension and anxiety. EXAM: CT HEAD WITHOUT CONTRAST TECHNIQUE: Contiguous axial images were obtained from the base of the skull through the vertex without intravenous contrast. COMPARISON:  None. FINDINGS: Brain: No evidence of acute infarction, hemorrhage, hydrocephalus, extra-axial collection or mass lesion/mass effect. Vascular: No hyperdense vessel or unexpected calcification. Skull: Normal. Negative for fracture or focal lesion. Sinuses/Orbits: Globes and orbits are unremarkable. Visualized sinuses are clear. Other: None. IMPRESSION: Normal unenhanced CT scan of the brain. Electronically Signed   By: Amie Portland M.D.   On: 03/13/2021 12:51    Procedures Procedures   Medications Ordered in  ED Medications - No data to display  ED Course  I have reviewed the triage vital signs and the nursing notes.  Pertinent labs & imaging results that were available during my care of the patient were reviewed by me and considered in my medical decision making (see chart for details).    MDM Rules/Calculators/A&P                           BP 129/81 (BP Location: Left Arm)   Pulse 82   Temp  98.2 F (36.8 C) (Oral)   Resp 16   Ht 5\' 1"  (1.549 m)   Wt 96.2 kg   SpO2 98%   BMI 40.06 kg/m   Final Clinical Impression(s) / ED Diagnoses Final diagnoses:  Heart palpitations    Rx / DC Orders ED Discharge Orders     None      3:31 PM Patient reported an episode of heart palpitation earlier in the day.  Also endorsing throbbing frontal headache but attributes to feeling hungry.  When she was first seen in the ED, head CT scan was obtained to rule out subarachnoid hemorrhage.  CT scan was done within the timeframe back will rule out subarachnoid hemorrhage.  CT scan unremarkable.  Labs are reassuring.  Her blood pressure is normal here.  She is overall well-appearing.  No acute emergent medical condition identified.  Low suspicion for PE or DVT.  Doubt stroke.  Patient stable for discharge.  Return precaution given.  Outpatient follow-up recommended.   , PA-C 03/13/21 1534    03/15/21, MD 03/13/21 2219

## 2021-03-13 NOTE — ED Provider Notes (Signed)
Emergency Medicine Provider Triage Evaluation Note  Jody Solis , a 50 y.o. female  was evaluated in triage.  Pt complains of headache, anxiety, and palpitations.  Patient reports that palpitations and headache started suddenly at 1000 today.  Pain was to frontal temporal aspect of head.  Headache has been constant since then.  Endorses photophobia.  Patient denies any visual disturbance, chest pain, shortness of breath, nausea, vomiting, abdominal pain, neck pain, neck stiffness, leg swelling or tenderness, numbness, weakness, facial asymmetry, aphasia, dysphagia.  Review of Systems  Positive: Headache, anxiety, palpitations Negative: visual disturbance, chest pain, shortness of breath, nausea, vomiting, abdominal pain, neck pain, neck stiffness, leg swelling or tenderness, numbness, weakness, facial asymmetry, aphasia, dysphagia.  Physical Exam  BP 129/81 (BP Location: Left Arm)   Pulse 82   Temp 98.2 F (36.8 C) (Oral)   Resp 16   Ht 5\' 1"  (1.549 m)   Wt 96.2 kg   SpO2 98%   BMI 40.06 kg/m  Gen:   Awake, no distress   Resp:  Normal effort, lungs clear to auscultation bilaterally MSK:   Moves extremities without difficulty, no swelling or tenderness to bilateral lower extremities Other:  No facial asymmetry, aphasia, or dysphagia noted.  Medical Decision Making  Medically screening exam initiated at 12:10 PM.  Appropriate orders placed.  LILLIANNA SABEL was informed that the remainder of the evaluation will be completed by another provider, this initial triage assessment does not replace that evaluation, and the importance of remaining in the ED until their evaluation is complete.  Do to patient's sudden onset of headache will obtain noncontrast head CT to evaluate for possible SAH.   Haywood Pao, PA-C 03/13/21 1213    03/15/21, MD 03/13/21 1224

## 2021-09-08 ENCOUNTER — Ambulatory Visit (HOSPITAL_COMMUNITY)
Admission: EM | Admit: 2021-09-08 | Discharge: 2021-09-08 | Disposition: A | Discharge status: Home / Self Care | Payer: 59 | Attending: Physician Assistant | Admitting: Physician Assistant

## 2021-09-08 ENCOUNTER — Encounter (HOSPITAL_COMMUNITY): Payer: Self-pay

## 2021-09-08 DIAGNOSIS — J029 Acute pharyngitis, unspecified: Secondary | ICD-10-CM | POA: Diagnosis not present

## 2021-09-08 DIAGNOSIS — M545 Low back pain, unspecified: Secondary | ICD-10-CM | POA: Insufficient documentation

## 2021-09-08 LAB — POCT RAPID STREP A, ED / UC: Streptococcus, Group A Screen (Direct): NEGATIVE

## 2021-09-08 MED ORDER — PREDNISONE 20 MG PO TABS: 40.0000 mg | ORAL_TABLET | Freq: Every day | ORAL | 0 refills | Status: AC

## 2021-09-08 MED ORDER — TIZANIDINE HCL 4 MG PO TABS: 4.0000 mg | ORAL_TABLET | Freq: Three times a day (TID) | ORAL | 0 refills | Status: DC | PRN

## 2021-09-08 NOTE — Discharge Instructions (Signed)
Your strep was negative.  We will contact you if your throat culture is positive we will need to start an antibiotic.  Take prednisone as that should help with pain and inflammation.  You should not take NSAIDs including aspirin, ibuprofen/Advil, naproxen/Aleve with this medication as it can cause stomach bleeding.  You can use Tylenol for breakthrough pain.  This should also help with your back pain.  Take Zanaflex for back pain.  This will make you sleepy so do not drive or drink alcohol while taking it.  If at any point you have worsening symptoms please return for reevaluation.  If symptoms not improving by next week please see your PCP or return here.

## 2021-09-08 NOTE — ED Triage Notes (Signed)
Pt c/o sore throat x3 days, states was burning after eating fruit today.   Pt c/o mid back pain x2 days, denies injury.

## 2021-09-08 NOTE — ED Provider Notes (Signed)
[facility]    CSN: [id number] Arrival date & time: 09/08/21  1752      History   Chief Complaint Chief Complaint  Patient presents with   Sore Throat   Back Pain    HPI Jody Solis is a 51 y.o. female.   Patient presents today with several concerns.  She reports a 3-day history of sore throat.  Pain is rated 6 on a 0-10 pain scale, localized to posterior oropharynx, described as burning, worse with swallowing, no alleviating factors identified.  She denies any recent illness or additional symptoms including cough, congestion, fever, nausea, vomiting.  Denies any known sick contacts.  She is not taking any over-the-counter medications but has been using herbal tea without improvement of symptoms.  She denies any difficulty speaking, difficulty swallowing, muffled voice.  Denies any recent antibiotic use.  In addition, patient reports a several day history of thoracic and lumbar back pain.  Pain is rated 7 on a 0-10 pain scale, localized to midline thoracic and lumbar back with radiation laterally, described as aching/soreness, no leaving factors identified.  Patient does report that she was moving furniture prior to symptom onset.  She denies any urinary symptoms including flank pain, abdominal pain, fever, nausea, vomiting, hematuria.  She has not tried any over-the-counter medication for symptom management.  Denies any bowel/bladder incontinence, lower extremity weakness, saddle anesthesia.  Denies history of malignancy.  She denies previous spinal injury or surgery.   History reviewed. No pertinent past medical history.  Patient Active Problem List   Diagnosis Date Noted   CANDIDIASIS, SKIN 11/04/2008   [facility] 08/09/2007    Past Surgical History:  Procedure Laterality Date   ABDOMINAL HYSTERECTOMY  2003   [place]    OB History   No obstetric history on file.      Home Medications    Prior to  Admission medications   Medication Sig Start Date End Date Taking? Authorizing Provider  predniSONE (DELTASONE) 20 MG tablet Take 2 tablets (40 mg total) by mouth daily for 4 days. 09/08/21 09/12/21 Yes Tequilla Cousineau K, PA-C  tiZANidine (ZANAFLEX) 4 MG tablet Take 1 tablet (4 mg total) by mouth every 8 (eight) hours as needed for muscle spasms. 09/08/21  Yes Margaretmary Prisk, Derry Skill, PA-C    Family History Family History  Problem Relation Age of Onset   Healthy Sister    Healthy Brother    Healthy Brother    Healthy Sister    Healthy Sister    Healthy Sister    Healthy Sister    Healthy Sister    Lupus Sister    Asthma Mother    Hypertension Mother    Diabetes Father    Hypertension Father     Social History Social History   Tobacco Use   Smoking status: Never   Smokeless tobacco: Never  Vaping Use   Vaping Use: Never used  Substance Use Topics   Alcohol use: No   Drug use: No     Allergies   Patient has no known allergies.   Review of Systems Review of Systems  Constitutional:  Positive for activity change. Negative for appetite change, fatigue and fever.  HENT:  Positive for sore throat. Negative for congestion, sinus pressure, sneezing, trouble swallowing and voice change.   Respiratory:  Negative for cough and shortness of breath.   Cardiovascular:  Negative for chest pain.  Gastrointestinal:  Negative for abdominal pain, diarrhea, nausea and vomiting.  Genitourinary:  Negative for dysuria, frequency and urgency.  Musculoskeletal:  Positive for back pain. Negative for arthralgias and myalgias.  Neurological:  Negative for dizziness, weakness, light-headedness, numbness and headaches.    Physical Exam Triage Vital Signs ED Triage Vitals  Enc Vitals Group     BP 09/08/21 1859 131/65     Pulse Rate 09/08/21 1859 70     Resp 09/08/21 1859 18     Temp 09/08/21 1859 99.3 F (37.4 C)     Temp Source 09/08/21 1859 Oral     SpO2 09/08/21 1859 100 %     Weight --       Height --      Head Circumference --      Peak Flow --      Pain Score 09/08/21 1900 7     Pain Loc --      Pain Edu? --      Excl. in [place]? --    No data found.  Updated Vital Signs BP 131/65 (BP Location: Left Arm)    Pulse 70    Temp 99.3 F (37.4 C) (Oral)    Resp 18    SpO2 100%   Visual Acuity Right Eye Distance:   Left Eye Distance:   Bilateral Distance:    Right Eye Near:   Left Eye Near:    Bilateral Near:     Physical Exam Vitals reviewed.  Constitutional:      General: She is awake. She is not in acute distress.    Appearance: Normal appearance. She is well-developed. She is not ill-appearing.     Comments: Very pleasant female appears stated age in no acute distress sitting comfortably in exam room  HENT:     Head: Normocephalic and atraumatic.     Right Ear: Tympanic membrane, ear canal and external ear normal. Tympanic membrane is not erythematous or bulging.     Left Ear: Tympanic membrane, ear canal and external ear normal. Tympanic membrane is not erythematous or bulging.     Nose:     Right Sinus: No maxillary sinus tenderness or frontal sinus tenderness.     Left Sinus: No maxillary sinus tenderness or frontal sinus tenderness.     Mouth/Throat:     Pharynx: Uvula midline. Posterior oropharyngeal erythema present. No oropharyngeal exudate.  Cardiovascular:     Rate and Rhythm: Normal rate and regular rhythm.     Heart sounds: Normal heart sounds, S1 normal and S2 normal. No murmur heard. Pulmonary:     Effort: Pulmonary effort is normal.     Breath sounds: Normal breath sounds. No wheezing, rhonchi or rales.     Comments: Clear to auscultation bilaterally Abdominal:     General: Bowel sounds are normal.     Palpations: Abdomen is soft.     Tenderness: There is no abdominal tenderness. There is no right CVA tenderness, left CVA tenderness, guarding or rebound.  Musculoskeletal:     Cervical back: No tenderness or bony tenderness.     Thoracic back:  Tenderness present. No bony tenderness.     Lumbar back: Tenderness present. No bony tenderness.     Comments: Strength 5/5 bilateral lower extremities  Lymphadenopathy:     Head:     Right side of head: No submental, submandibular or tonsillar adenopathy.     Left side of head: No submental, submandibular or tonsillar adenopathy.     Cervical: No  cervical adenopathy.  Psychiatric:        Behavior: Behavior is cooperative.     UC Treatments / Results  Labs (all labs ordered are listed, but only abnormal results are displayed) Labs Reviewed  CULTURE, GROUP A STREP [facility])  POCT RAPID STREP A, ED / UC    EKG   Radiology No results found.  Procedures Procedures (including critical care time)  Medications Ordered in UC Medications - No data to display  Initial Impression / Assessment and Plan / UC Course  I have reviewed the triage vital signs and the nursing notes.  Pertinent labs & imaging results that were available during my care of the patient were reviewed by me and considered in my medical decision making (see chart for details).     Strep test was negative.  Discussed likely viral etiology.  Throat culture was obtained but we will defer antibiotics until results are obtained.  We will start prednisone to help with symptoms and she was instructed not to take NSAIDs.  She can gargle with warm salt water for additional symptom relief.  If she has any worsening symptoms including difficulty speaking, difficulty swallowing, shortness of breath, high fever she needs to be seen immediately.  Strict return precautions given to which she expressed understanding.  No indication for plain films as patient denies any recent trauma and has no significant bony tenderness.  Discussed that prednisone should help with symptoms and she should avoid NSAIDs.  She can use Tylenol for breakthrough pain.  She was prescribed Zanaflex with instruction not to drive drink alcohol with taking this  medication.  She can use heat, rest, stretch for additional symptom relief.  Discussed that if she has any worsening symptoms she needs to be reevaluated medially including difficulty using the restroom, weakness, numbness, lower extremity weakness.  Strict return precautions given to which she expressed understanding.  Work excuse note provided.  Final Clinical Impressions(s) / UC Diagnoses   Final diagnoses:  Pharyngitis, unspecified etiology  Sore throat  Acute bilateral low back pain without sciatica     Discharge Instructions      Your strep was negative.  We will contact you if your throat culture is positive we will need to start an antibiotic.  Take prednisone as that should help with pain and inflammation.  You should not take NSAIDs including aspirin, ibuprofen/Advil, naproxen/Aleve with this medication as it can cause stomach bleeding.  You can use Tylenol for breakthrough pain.  This should also help with your back pain.  Take Zanaflex for back pain.  This will make you sleepy so do not drive or drink alcohol while taking it.  If at any point you have worsening symptoms please return for reevaluation.  If symptoms not improving by next week please see your PCP or return here.     ED Prescriptions     Medication Sig Dispense Auth. Provider   predniSONE (DELTASONE) 20 MG tablet Take 2 tablets (40 mg total) by mouth daily for 4 days. 8 tablet Nikan Ellingson K, PA-C   tiZANidine (ZANAFLEX) 4 MG tablet Take 1 tablet (4 mg total) by mouth every 8 (eight) hours as needed for muscle spasms. 21 tablet Ladarrious Kirksey, Derry Skill, PA-C      PDMP not reviewed this encounter.   Terrilee Croak, PA-C 09/08/21 2013

## 2021-09-11 LAB — CULTURE, GROUP A STREP (THRC)

## 2022-04-15 IMAGING — CT CT HEAD W/O CM
3 series · 14 of 47 positions shown, 16 images · non-contrast
Comparison: None.

CLINICAL DATA: Headache, hypertension and anxiety.

EXAM:
CT HEAD WITHOUT CONTRAST
TECHNIQUE: Contiguous axial images were obtained from the base of the skull
through the vertex without intravenous contrast.

[Series 2: head wo · axial · 0.47mm/px · z∈[-141,-6]mm · 8 of 33 slices shown, 10 images]
[im 3/33  brain]
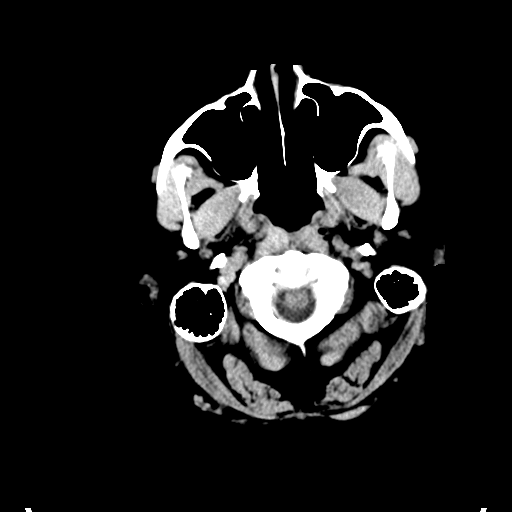
[im 3/33  bone]
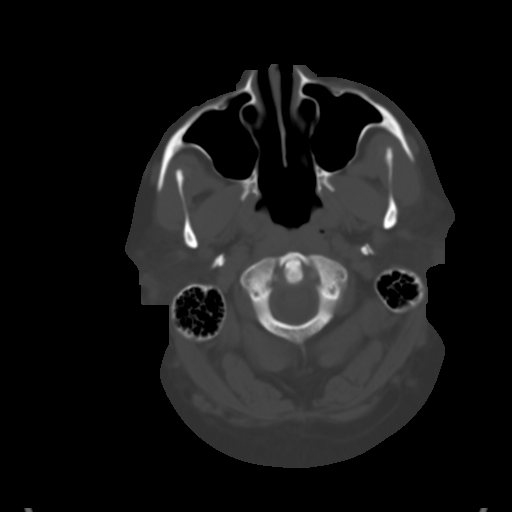
[im 7/33  brain]
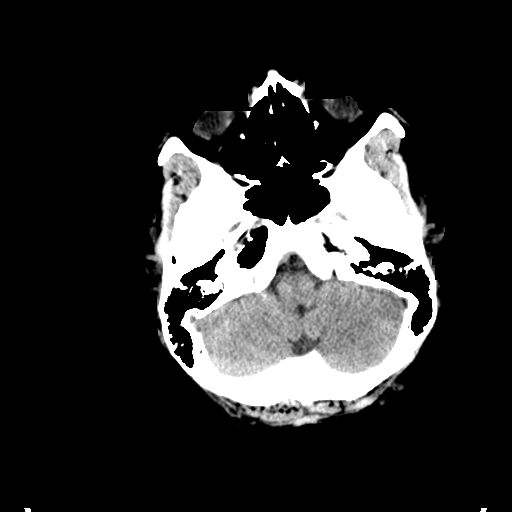
[im 10/33  brain]
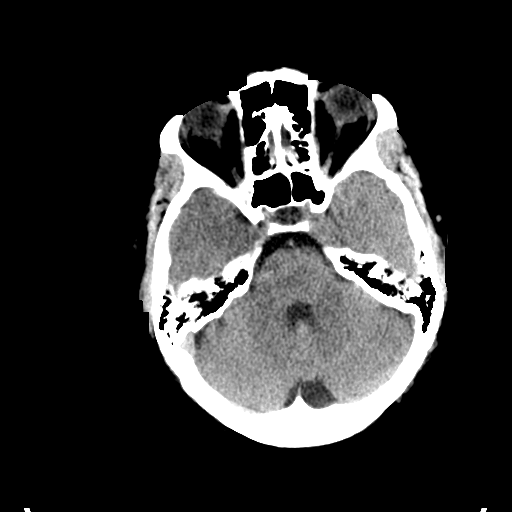
[im 15/33  brain]
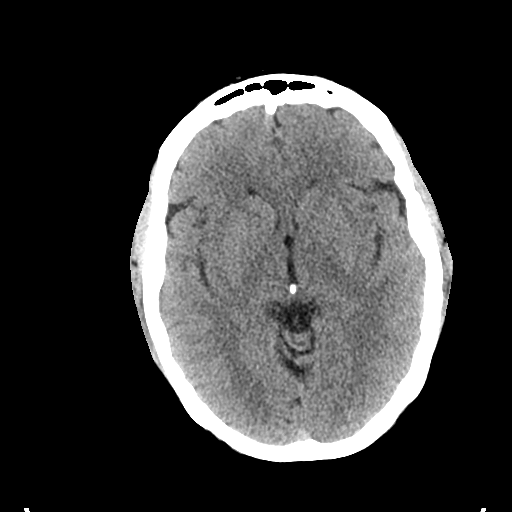
[im 18/33  brain]
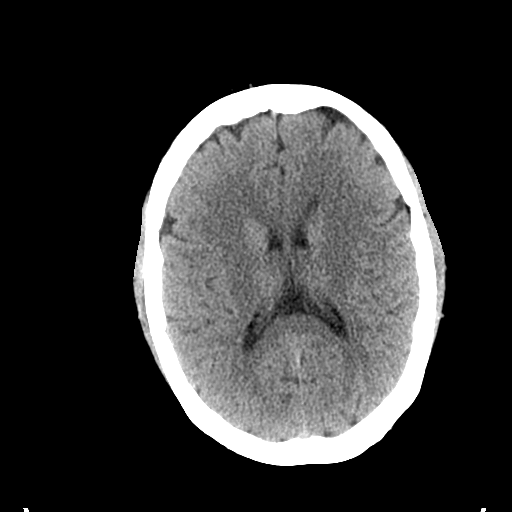
[im 18/33  bone]
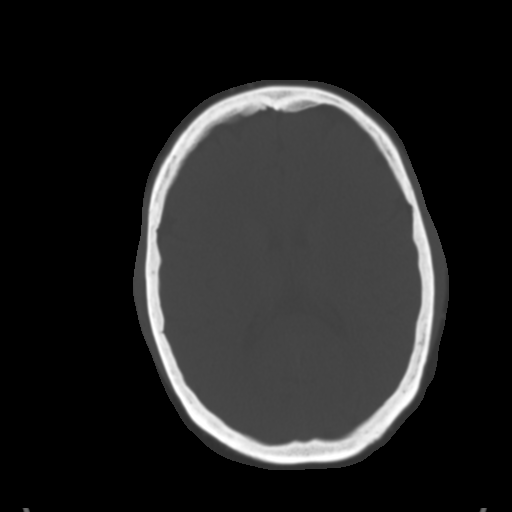
[im 23/33  brain]
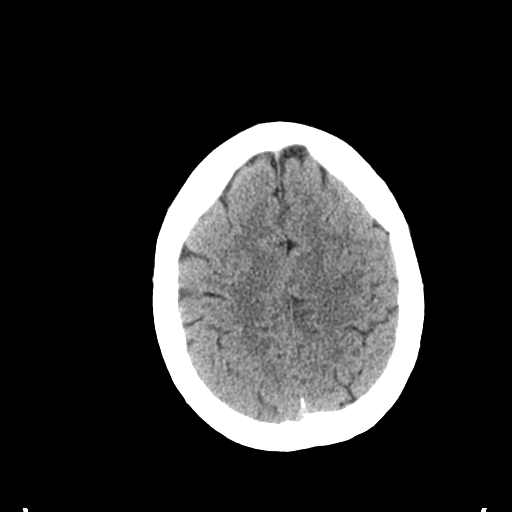
[im 26/33  brain]
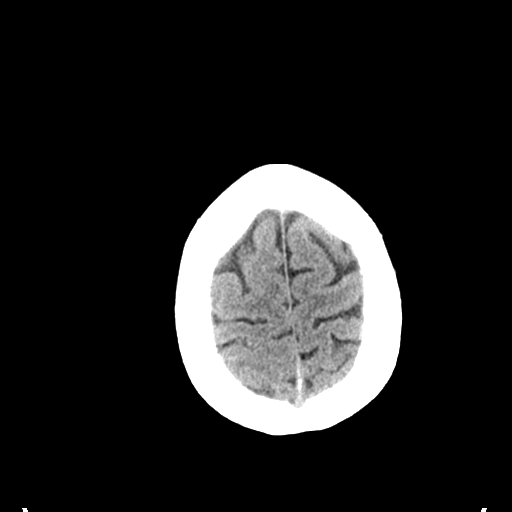
[im 30/33  brain]
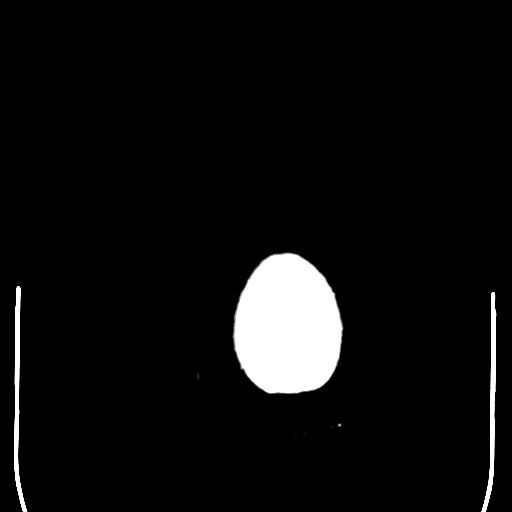

[Series 5: coronal soft tissue · coronal · 0.34mm/px · 3 of 84 slices shown]
[im 28/84  brain]
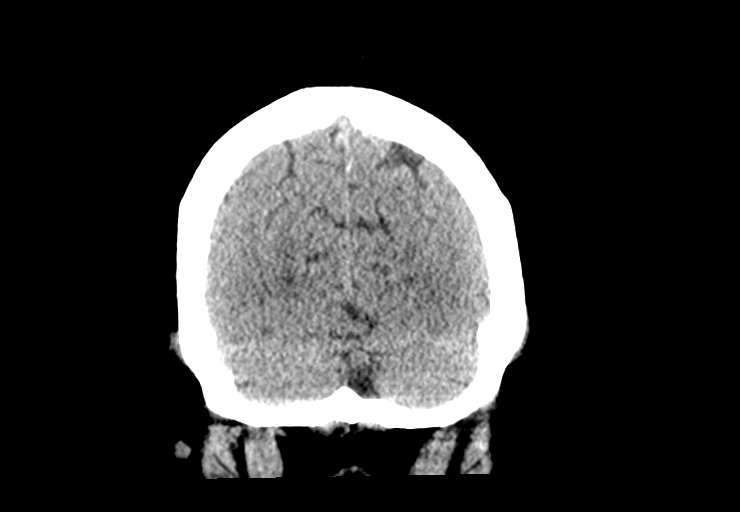
[im 37/84  brain]
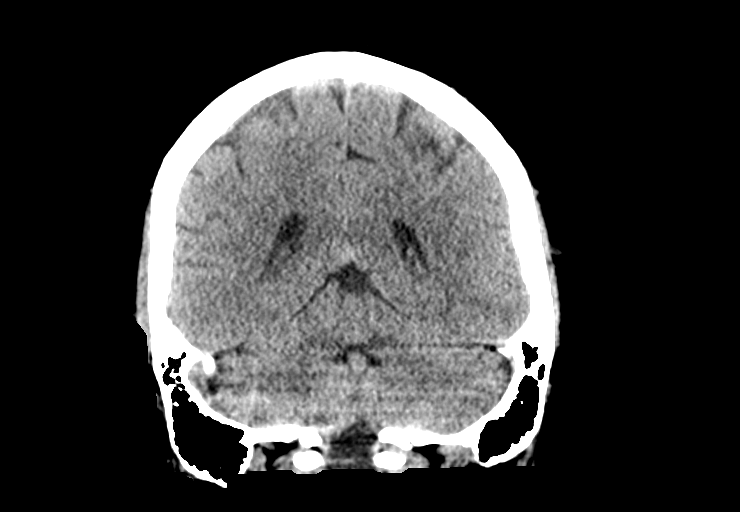
[im 47/84  brain]
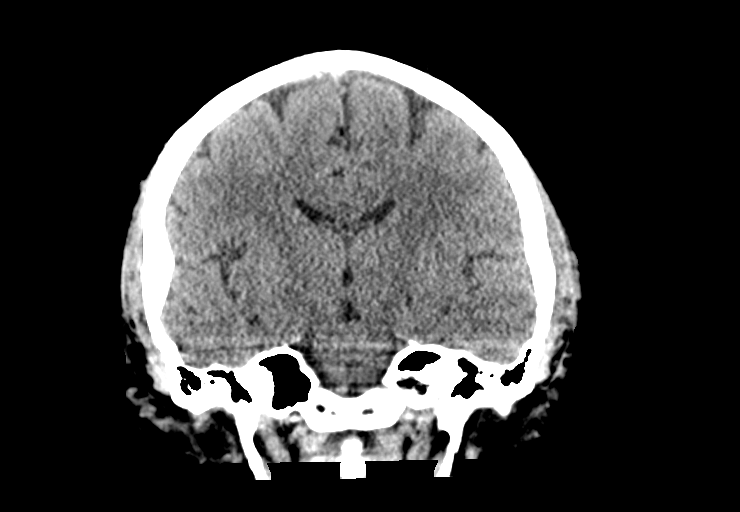

[Series 6: sagittal soft tissue · sagittal · 0.34mm/px · 3 of 62 slices shown]
[im 21/62  brain]
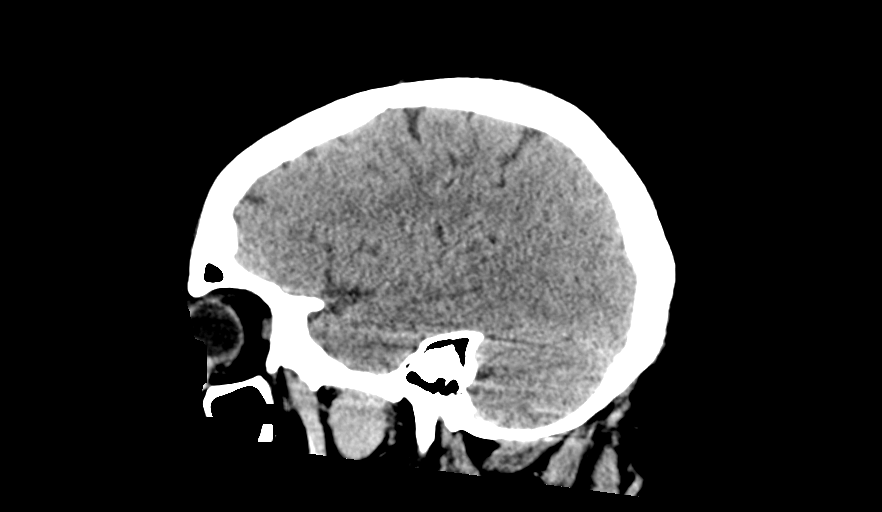
[im 31/62  brain]
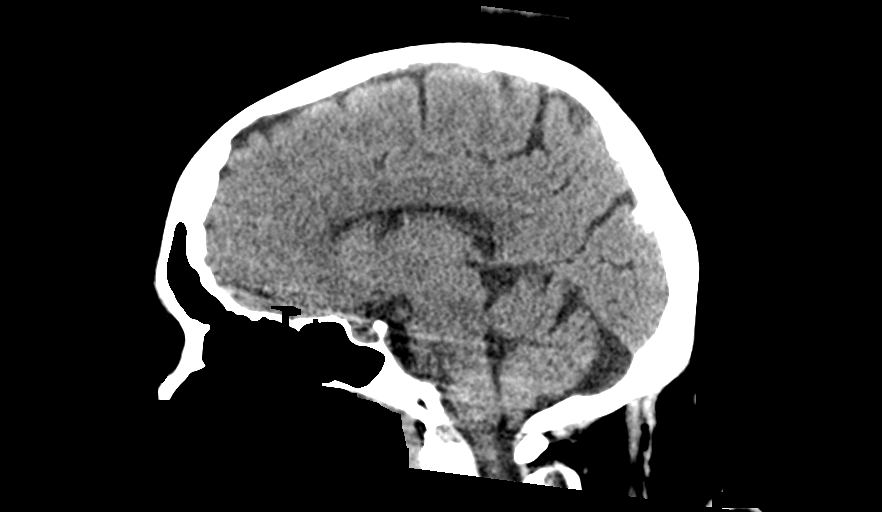
[im 41/62  brain]
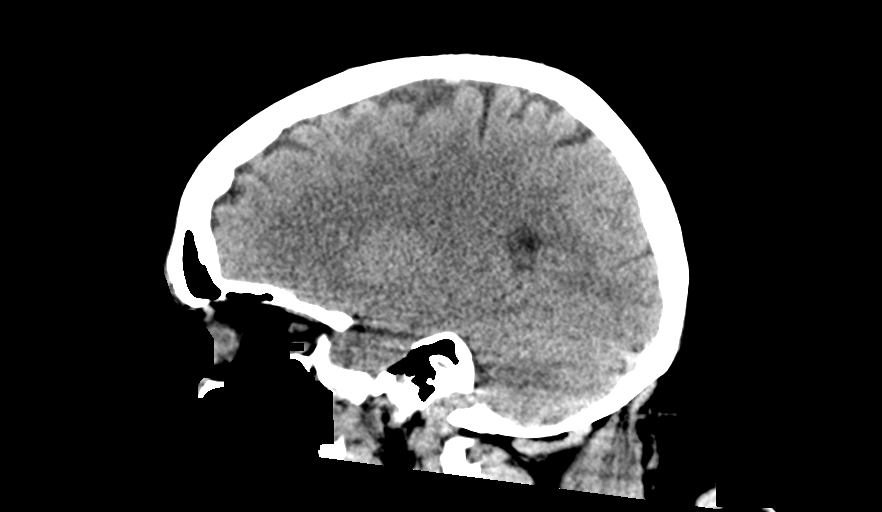

[14 of 47 positions shown; findings below may reference images not displayed]

FINDINGS: Brain: No evidence of acute infarction, hemorrhage, hydrocephalus,
extra-axial collection or mass lesion/mass effect.

Vascular: No hyperdense vessel or unexpected calcification.

Skull: Normal. Negative for fracture or focal lesion.

Sinuses/Orbits: Globes and orbits are unremarkable. Visualized
sinuses are clear.

Other: None.
IMPRESSION: Normal unenhanced CT scan of the brain.

## 2022-04-17 ENCOUNTER — Ambulatory Visit
Admission: EM | Admit: 2022-04-17 | Discharge: 2022-04-17 | Disposition: A | Discharge status: Home / Self Care | Payer: 59 | Attending: Urgent Care | Admitting: Urgent Care

## 2022-04-17 DIAGNOSIS — H6982 Other specified disorders of Eustachian tube, left ear: Secondary | ICD-10-CM

## 2022-04-17 DIAGNOSIS — J309 Allergic rhinitis, unspecified: Secondary | ICD-10-CM

## 2022-04-17 DIAGNOSIS — J018 Other acute sinusitis: Secondary | ICD-10-CM

## 2022-04-17 DIAGNOSIS — H6992 Unspecified Eustachian tube disorder, left ear: Secondary | ICD-10-CM

## 2022-04-17 DIAGNOSIS — J3489 Other specified disorders of nose and nasal sinuses: Secondary | ICD-10-CM | POA: Diagnosis not present

## 2022-04-17 MED ORDER — FLUTICASONE PROPIONATE 50 MCG/ACT NA SUSP: 2.0000 | Freq: Every day | NASAL | 12 refills | Status: DC

## 2022-04-17 MED ORDER — PSEUDOEPHEDRINE HCL 60 MG PO TABS: 60.0000 mg | ORAL_TABLET | Freq: Three times a day (TID) | ORAL | 0 refills | Status: DC | PRN

## 2022-04-17 MED ORDER — AMOXICILLIN-POT CLAVULANATE 875-125 MG PO TABS: 1.0000 | ORAL_TABLET | Freq: Two times a day (BID) | ORAL | 0 refills | Status: DC

## 2022-04-17 MED ORDER — CETIRIZINE HCL 10 MG PO TABS: 10.0000 mg | ORAL_TABLET | Freq: Every day | ORAL | 0 refills | Status: DC

## 2022-04-17 NOTE — ED Triage Notes (Addendum)
Pt c/o elevated BP (today) , she states it feels like her left nostril is occluded (started a while ago) and reports hearing white noise in her left ear.

## 2022-04-17 NOTE — Discharge Instructions (Addendum)
Start taking Zyrtec daily today. Start amoxicillin-clavulanate to address a secondary sinus infection. Once you finish the antibiotic course, start taking Flonase daily. Today, you can also start using pseudoephedrine to help clear your sinuses. Use it this week and then you can use only as needed in the future.

## 2022-04-17 NOTE — ED Provider Notes (Signed)
[facility]  Note:  This document was prepared using Conservation officer, historic buildings and may include unintentional dictation errors.  MRN: [id number] DOB: 05/22/1971  Subjective:   Jody Solis is a 51 y.o. female presenting for 1-2 month history of persistent left-sided sinus congestion deep in her nostril, left-sided facial pressure and persistent tinnitus of her left ear.  No fever, oral pain, throat pain, coughing, ear drainage, dizziness.  Has not taken any medications for relief.  No current facility-administered medications for this encounter.  Current Outpatient Medications:    tiZANidine (ZANAFLEX) 4 MG tablet, Take 1 tablet (4 mg total) by mouth every 8 (eight) hours as needed for muscle spasms., Disp: 21 tablet, Rfl: 0   No Known Allergies  History reviewed. No pertinent past medical history.   Past Surgical History:  Procedure Laterality Date   ABDOMINAL HYSTERECTOMY  2003   ANKLE SURGERY     CESAREAN SECTION     UTERINE FIBROID SURGERY  1995    Family History  Problem Relation Age of Onset   Healthy Sister    Healthy Brother    Healthy Brother    Healthy Sister    Healthy Sister    Healthy Sister    Healthy Sister    Healthy Sister    Lupus Sister    Asthma Mother    Hypertension Mother    Diabetes Father    Hypertension Father     Social History   Tobacco Use   Smoking status: Never   Smokeless tobacco: Never  Vaping Use   Vaping Use: Never used  Substance Use Topics   Alcohol use: No   Drug use: No    ROS   Objective:   Vitals: BP 112/81 (BP Location: Right Arm)   Pulse 76   Temp 98.7 F (37.1 C) (Oral)   Resp 16   SpO2 97%   Physical Exam Constitutional:      General: She is not in acute distress.    Appearance: Normal appearance. She is well-developed and normal weight. She is not ill-appearing, toxic-appearing or diaphoretic.  HENT:     Head: Normocephalic and atraumatic.     Right Ear:  Tympanic membrane, ear canal and external ear normal. No drainage or tenderness. No middle ear effusion. There is no impacted cerumen. Tympanic membrane is not erythematous.     Left Ear: Tympanic membrane, ear canal and external ear normal. No drainage or tenderness.  No middle ear effusion. There is no impacted cerumen. Tympanic membrane is not erythematous.     Nose: Congestion (left sided with associated boggy nasal mucosa) present. No rhinorrhea.     Mouth/Throat:     Mouth: Mucous membranes are moist. No oral lesions.     Pharynx: No pharyngeal swelling, oropharyngeal exudate, posterior oropharyngeal erythema or uvula swelling.     Tonsils: No tonsillar exudate or tonsillar abscesses.  Eyes:     General: No scleral icterus.       Right eye: No discharge.        Left eye: No discharge.     Extraocular Movements: Extraocular movements intact.     Right eye: Normal extraocular motion.     Left eye: Normal extraocular motion.     Conjunctiva/sclera: Conjunctivae normal.  Cardiovascular:     Rate and Rhythm: Normal rate.  Pulmonary:     Effort: Pulmonary effort is normal.  Musculoskeletal:     Cervical back: Normal range of motion and neck  supple.  Lymphadenopathy:     Cervical: No cervical adenopathy.  Skin:    General: Skin is warm and dry.  Neurological:     General: No focal deficit present.     Mental Status: She is alert and oriented to person, place, and time.  Psychiatric:        Mood and Affect: Mood normal.        Behavior: Behavior normal.      Assessment and Plan :   PDMP not reviewed this encounter.  1. Acute non-recurrent sinusitis of other sinus   2. Sinus pressure   3. Eustachian tube dysfunction, left   4. Allergic rhinitis, unspecified seasonality, unspecified trigger     Recommended addressing her symptoms for allergic rhinitis causing a secondary sinusitis.  Start Zyrtec daily, use pseudoephedrine this week and then as needed.  Start Augmentin to  address a secondary sinusitis.  Thereafter she completes this course start Flonase and maintain daily. Counseled patient on potential for adverse effects with medications prescribed/recommended today, ER and return-to-clinic precautions discussed, patient verbalized understanding.    Jaynee Eagles, [place] 04/17/22 1241

## 2022-04-25 ENCOUNTER — Ambulatory Visit
Admission: EM | Admit: 2022-04-25 | Discharge: 2022-04-25 | Disposition: A | Discharge status: Home / Self Care | Payer: 59

## 2022-04-25 DIAGNOSIS — H9312 Tinnitus, left ear: Secondary | ICD-10-CM

## 2022-04-25 NOTE — ED Triage Notes (Signed)
The pt states she is still having issues with her left ear (hearing a noise).

## 2022-04-25 NOTE — ED Provider Notes (Signed)
[facility]    CSN: [id number] Arrival date & time: 04/25/22  [id number]      History   Chief Complaint Chief Complaint  Patient presents with   Otalgia    HPI Jody Solis is a 51 y.o. female. She was seen 04/17/22 for 1-2  months of persistent left-sided sinus congestion deep in her nostril, left-sided facial pressure and persistent tinnitus of her left ear. She was started on zyrtec and flonase and treated with augmentin. Finished augmentin 04/23/22 and felt much better but still has tinnitus in L ear. No change in hearing.    Otalgia   History reviewed. No pertinent past medical history.  Patient Active Problem List   Diagnosis Date Noted   CANDIDIASIS, SKIN 11/04/2008   [facility] 08/09/2007    Past Surgical History:  Procedure Laterality Date   ABDOMINAL HYSTERECTOMY  2003   [place]    OB History   No obstetric history on file.      Home Medications    Prior to Admission medications   Medication Sig Start Date End Date Taking? Authorizing Provider  amoxicillin-clavulanate (AUGMENTIN) 875-125 MG tablet Take 1 tablet by mouth 2 (two) times daily. 04/17/22   Jaynee Eagles, PA-C  cetirizine (ZYRTEC ALLERGY) 10 MG tablet Take 1 tablet (10 mg total) by mouth daily. 04/17/22   Jaynee Eagles, PA-C  fluticasone (FLONASE) 50 MCG/ACT nasal spray Place 2 sprays into both nostrils daily. 04/17/22   Jaynee Eagles, PA-C  pseudoephedrine (SUDAFED) 60 MG tablet Take 1 tablet (60 mg total) by mouth every 8 (eight) hours as needed for congestion. 04/17/22   Jaynee Eagles, PA-C  tiZANidine (ZANAFLEX) 4 MG tablet Take 1 tablet (4 mg total) by mouth every 8 (eight) hours as needed for muscle spasms. 09/08/21   Raspet, Derry Skill, PA-C    Family History Family History  Problem Relation Age of Onset   Healthy Sister    Healthy Brother    Healthy Brother    Healthy Sister    Healthy Sister    Healthy Sister    Healthy Sister     Healthy Sister    Lupus Sister    Asthma Mother    Hypertension Mother    Diabetes Father    Hypertension Father     Social History Social History   Tobacco Use   Smoking status: Never   Smokeless tobacco: Never  Vaping Use   Vaping Use: Never used  Substance Use Topics   Alcohol use: No   Drug use: No     Allergies   Patient has no known allergies.   Review of Systems Review of Systems  HENT:  Positive for ear pain.      Physical Exam Triage Vital Signs ED Triage Vitals [04/25/22 0848]  Enc Vitals Group     BP 125/81     Pulse Rate 82     Resp 16     Temp 98.9 F (37.2 C)     Temp Source Oral     SpO2 98 %     Weight      Height      Head Circumference      Peak Flow      Pain Score 0     Pain Loc      Pain Edu?      Excl. in [place]?    No data found.  Updated  Vital Signs BP 125/81 (BP Location: Right Arm)   Pulse 82   Temp 98.9 F (37.2 C) (Oral)   Resp 16   SpO2 98%   Visual Acuity Right Eye Distance:   Left Eye Distance:   Bilateral Distance:    Right Eye Near:   Left Eye Near:    Bilateral Near:     Physical Exam Constitutional:      Appearance: Normal appearance.  HENT:     Right Ear: Tympanic membrane, ear canal and external ear normal.     Left Ear: Tympanic membrane, ear canal and external ear normal.     Nose: No congestion or rhinorrhea.     Right Sinus: No maxillary sinus tenderness or frontal sinus tenderness.     Left Sinus: No maxillary sinus tenderness or frontal sinus tenderness.     Comments: Hearing grossly intact Pulmonary:     Effort: Pulmonary effort is normal.  Lymphadenopathy:     Head:     Right side of head: No submental, submandibular or tonsillar adenopathy.     Left side of head: No submental, submandibular, tonsillar, preauricular or posterior auricular adenopathy.     Cervical: No cervical adenopathy.  Neurological:     Mental Status: She is alert.      UC Treatments / Results  Labs (all labs  ordered are listed, but only abnormal results are displayed) Labs Reviewed - No data to display  EKG   Radiology No results found.  Procedures Procedures (including critical care time)  Medications Ordered in UC Medications - No data to display  Initial Impression / Assessment and Plan / UC Course  I have reviewed the triage vital signs and the nursing notes.  Pertinent labs & imaging results that were available during my care of the patient were reviewed by me and considered in my medical decision making (see chart for details).    Pt's symptoms resolved except still has tinnitus. REfered to ENT  Final Clinical Impressions(s) / UC Diagnoses   Final diagnoses:  Tinnitus of left ear     Discharge Instructions      Contact one of the 3 Ears/Nose/Throat practices below (or all of them! Find the clinic that can see you soonest that works with your insurance the best) to get an appointment for further care.    ED Prescriptions   None    PDMP not reviewed this encounter.   Carvel Getting, NP 04/25/22 [phone]

## 2022-04-25 NOTE — Discharge Instructions (Signed)
Contact one of the 3 Ears/Nose/Throat practices below (or all of them! Find the clinic that can see you soonest that works with your insurance the best) to get an appointment for further care.

## 2022-07-05 ENCOUNTER — Emergency Department (HOSPITAL_COMMUNITY)
Admission: EM | Admit: 2022-07-05 | Discharge: 2022-07-05 | Disposition: A | Discharge status: Home / Self Care | Payer: 59 | Attending: Emergency Medicine | Admitting: Emergency Medicine

## 2022-07-05 ENCOUNTER — Encounter (HOSPITAL_COMMUNITY): Payer: Self-pay

## 2022-07-05 ENCOUNTER — Emergency Department (HOSPITAL_COMMUNITY): Payer: 59

## 2022-07-05 DIAGNOSIS — R55 Syncope and collapse: Secondary | ICD-10-CM | POA: Insufficient documentation

## 2022-07-05 DIAGNOSIS — R42 Dizziness and giddiness: Secondary | ICD-10-CM | POA: Diagnosis not present

## 2022-07-05 DIAGNOSIS — R002 Palpitations: Secondary | ICD-10-CM | POA: Diagnosis not present

## 2022-07-05 LAB — CBC
HCT: 40.5 % (ref 36.0–46.0)
Hemoglobin: 13.3 g/dL (ref 12.0–15.0)
MCH: 30.6 pg (ref 26.0–34.0)
MCHC: 32.8 g/dL (ref 30.0–36.0)
MCV: 93.3 fL (ref 80.0–100.0)
Platelets: 259 10*3/uL (ref 150–400)
RBC: 4.34 MIL/uL (ref 3.87–5.11)
RDW: 11.8 % (ref 11.5–15.5)
WBC: 3.9 10*3/uL — ABNORMAL LOW (ref 4.0–10.5)
nRBC: 0 % (ref 0.0–0.2)

## 2022-07-05 LAB — URINALYSIS, ROUTINE W REFLEX MICROSCOPIC
Bilirubin Urine: NEGATIVE
Glucose, UA: NEGATIVE mg/dL
Hgb urine dipstick: NEGATIVE
Ketones, ur: NEGATIVE mg/dL
Leukocytes,Ua: NEGATIVE
Nitrite: NEGATIVE
Protein, ur: NEGATIVE mg/dL
Specific Gravity, Urine: 1.001 — ABNORMAL LOW (ref 1.005–1.030)
pH: 6 (ref 5.0–8.0)

## 2022-07-05 LAB — BASIC METABOLIC PANEL
Anion gap: 7 (ref 5–15)
BUN: 9 mg/dL (ref 6–20)
CO2: 27 mmol/L (ref 22–32)
Calcium: 9.4 mg/dL (ref 8.9–10.3)
Chloride: 106 mmol/L (ref 98–111)
Creatinine, Ser: 0.57 mg/dL (ref 0.44–1.00)
GFR, Estimated: >60 mL/min (ref >60)
Glucose, Bld: 99 mg/dL (ref 70–99)
Potassium: 3.7 mmol/L (ref 3.5–5.1)
Sodium: 140 mmol/L (ref 135–145)

## 2022-07-05 LAB — TROPONIN I (HIGH SENSITIVITY): Troponin I (High Sensitivity): 3 ng/L (ref <18)

## 2022-07-05 NOTE — ED Triage Notes (Signed)
[facility] reports pt coming home c/o dizziness. Has been on and off for the past few months but today got worse.

## 2022-07-05 NOTE — ED Provider Triage Note (Signed)
Emergency Medicine Provider Triage Evaluation Note  Jody Solis , a 51 y.o. female  was evaluated in triage.  Pt complains of dizziness, lightheadedness.  Patient reports that prior to arrival she was at home she had sudden onset of lightheadedness, feeling like she was going to pass out.  Patient denies passing out or losing consciousness.  Patient reports that this is occurred in the past, she has been advised that she was having a panic attack.  Patient reports that today's episode was "more severe" prompted her to come in for evaluation.  Patient denies any chest pain, shortness of breath, nausea, vomiting, fevers.  Patient is endorsing dysuria, stating that it just "feels different" when she urinates.  Patient unable to elaborate on this.  Review of Systems  Positive:  Negative:   Physical Exam  BP (!) 137/95   Pulse 76   Temp 97.8 F (36.6 C) (Oral)   Resp 18   SpO2 100%  Gen:   Awake, no distress   Resp:  Normal effort  MSK:   Moves extremities without difficulty  Other:    Medical Decision Making  Medically screening exam initiated at 5:06 PM.  Appropriate orders placed.  Jody Solis was informed that the remainder of the evaluation will be completed by another provider, this initial triage assessment does not replace that evaluation, and the importance of remaining in the ED until their evaluation is complete.     Al Decant, PA-C 07/05/22 1707

## 2022-07-05 NOTE — Discharge Instructions (Addendum)
Please read and follow all provided instructions.  Your diagnoses today include:  1. Near syncope     Tests performed today include: An EKG of your heart A chest x-ray Cardiac enzymes - a blood test for heart muscle damage, no problems Blood counts and electrolytes Vital signs. See below for your results today.   Medications prescribed:  None  Take any prescribed medications only as directed.  Follow-up instructions: Please follow-up with your primary care provider as soon as you can for further evaluation of your symptoms.   Return instructions:  SEEK IMMEDIATE MEDICAL ATTENTION IF: You have severe chest pain, especially if the pain is crushing or pressure-like and spreads to the arms, back, neck, or jaw, or if you have sweating, nausea or vomiting, or trouble with breathing. THIS IS AN EMERGENCY. Do not wait to see if the pain will go away. Get medical help at once. Call 911. DO NOT drive yourself to the hospital.  Your chest pain gets worse and does not go away after a few minutes of rest.  You have an attack of chest pain lasting longer than what you usually experience.  You have significant dizziness, if you pass out, or have trouble walking.  You have chest pain not typical of your usual pain for which you originally saw your caregiver.  You have any other emergent concerns regarding your health.   Your vital signs today were: BP 100/83   Pulse 71   Temp 97.8 F (36.6 C) (Oral)   Resp (!) 28   SpO2 100%  If your blood pressure (BP) was elevated above 135/85 this visit, please have this repeated by your doctor within one month. --------------

## 2022-07-05 NOTE — ED Provider Notes (Signed)
[facility]-EMERGENCY DEPT Provider Note   CSN: [id number] Arrival date & time: 07/05/22  1642     History  Chief Complaint  Patient presents with   Dizziness    Jody Solis is a 51 y.o. female.  Patient presents to the emergency department today for evaluation of "dizziness" that she describes as a lightheaded feeling.  Symptoms occurred today while she was waiting for a call at work.  She was not doing anything strenuous.  She states that she just began to feel very lightheaded.  No associated full syncope, chest pain, shortness of breath.  She denies vertigo.  She has had recent treatment for ear infection/sinus infection a couple of months ago and had a follow-up visit for tinnitus which is persistent in the left ear.  No current fevers.  She has had some ongoing right shoulder pain, made worse with movements and palpation.  She has had some jaw pain and wonders if she had a infected tooth.  No abdominal pain, urinary symptoms, blood in the stool.  She states a history of borderline blood sugar and blood pressure, improved with diet when she became vegan a year ago.  She would also like to be checked to make sure she does not have any deficiencies from her diet.       Home Medications Prior to Admission medications   Medication Sig Start Date End Date Taking? Authorizing Provider  amoxicillin-clavulanate (AUGMENTIN) 875-125 MG tablet Take 1 tablet by mouth 2 (two) times daily. 04/17/22   Wallis Bamberg, PA-C  cetirizine (ZYRTEC ALLERGY) 10 MG tablet Take 1 tablet (10 mg total) by mouth daily. 04/17/22   Wallis Bamberg, PA-C  fluticasone (FLONASE) 50 MCG/ACT nasal spray Place 2 sprays into both nostrils daily. 04/17/22   Wallis Bamberg, PA-C  pseudoephedrine (SUDAFED) 60 MG tablet Take 1 tablet (60 mg total) by mouth every 8 (eight) hours as needed for congestion. 04/17/22   Wallis Bamberg, PA-C  tiZANidine (ZANAFLEX) 4 MG tablet Take 1 tablet (4 mg total) by mouth every 8  (eight) hours as needed for muscle spasms. 09/08/21   Raspet, Noberto Retort, PA-C      Allergies    Patient has no known allergies.    Review of Systems   Review of Systems  Physical Exam Updated Vital Signs BP 119/62   Pulse 73   Temp 97.8 F (36.6 C) (Oral)   Resp 18   SpO2 100%   Physical Exam Vitals and nursing note reviewed.  Constitutional:      Appearance: She is well-developed. She is not diaphoretic.  HENT:     Head: Normocephalic and atraumatic.     Right Ear: Tympanic membrane, ear canal and external ear normal.     Left Ear: Tympanic membrane, ear canal and external ear normal.     Ears:     Comments: TMs normal bilaterally    Mouth/Throat:     Mouth: Mucous membranes are moist. Mucous membranes are not dry.  Eyes:     Conjunctiva/sclera: Conjunctivae normal.  Neck:     Vascular: Normal carotid pulses. No JVD.     Trachea: Trachea normal. No tracheal deviation.  Cardiovascular:     Rate and Rhythm: Normal rate and regular rhythm.     Pulses: No decreased pulses.          Radial pulses are 2+ on the right side and 2+ on the left side.     Heart sounds: Normal heart sounds, S1  normal and S2 normal. No murmur heard. Pulmonary:     Effort: Pulmonary effort is normal. No respiratory distress.     Breath sounds: No wheezing.  Chest:     Chest wall: No tenderness.  Abdominal:     General: Bowel sounds are normal.     Palpations: Abdomen is soft.     Tenderness: There is no abdominal tenderness. There is no guarding or rebound.  Musculoskeletal:        General: Normal range of motion.     Cervical back: Normal range of motion and neck supple. No muscular tenderness.  Skin:    General: Skin is warm and dry.     Coloration: Skin is not pale.  Neurological:     Mental Status: She is alert.  Psychiatric:     Comments: Seems mildly anxious but appropriate     ED Results / Procedures / Treatments   Labs (all labs ordered are listed, but only abnormal results are  displayed) Labs Reviewed  CBC - Abnormal; Notable for the following components:      Result Value   WBC 3.9 (*)    All other components within normal limits  URINALYSIS, ROUTINE W REFLEX MICROSCOPIC - Abnormal; Notable for the following components:   Color, Urine COLORLESS (*)    Specific Gravity, Urine 1.001 (*)    All other components within normal limits  BASIC METABOLIC PANEL  TROPONIN I (HIGH SENSITIVITY)    ED ECG REPORT   Date: 07/05/2022  Rate: 72  Rhythm: normal sinus rhythm  QRS Axis: normal  Intervals: normal  ST/T Wave abnormalities: normal  Conduction Disutrbances:none  Narrative Interpretation: No prolonged QT, signs of Brugada syndrome or WPW, heart block, other arrhythmia  Old EKG Reviewed: unchanged  I have personally reviewed the EKG tracing and agree with the computerized printout as noted.   Radiology DG Chest 2 View  Result Date: 07/05/2022 CLINICAL DATA:  Palpitations EXAM: CHEST - 2 VIEW COMPARISON:  Chest x-ray 08/07/2007 FINDINGS: The heart size and mediastinal contours are within normal limits. Both lungs are clear. The visualized skeletal structures are unremarkable. IMPRESSION: No active cardiopulmonary disease. Electronically Signed   By: Darliss Cheney M.D.   On: 07/05/2022 19:18    Procedures Procedures    Medications Ordered in ED Medications - No data to display  ED Course/ Medical Decision Making/ A&P    Patient seen and examined. History obtained directly from patient.   Labs/EKG: CBC white blood cell count slightly low at 3.9 otherwise unremarkable; BMP unremarkable; UA unremarkable.  Reviewed EKG and interpreted as above.  Added troponin secondary to reported shoulder and neck pain, although symptoms atypical for ACS at this time.  Imaging: Ordered chest x-ray  Medications/Fluids: None ordered  Most recent vital signs reviewed and are as follows: BP 119/62   Pulse 73   Temp 97.8 F (36.6 C) (Oral)   Resp 18   SpO2 100%    Initial impression: Near syncope  8:25 PM Reassessment performed. Patient appears stable.  She is comfortable.  Labs personally reviewed and interpreted including: Troponin normal at 3.  Imaging personally visualized and interpreted including: Chest x-ray, agree negative.  Reviewed pertinent lab work and imaging with patient at bedside. Questions answered.   Most current vital signs reviewed and are as follows: BP 124/61   Pulse 77   Temp 98.3 F (36.8 C) (Oral)   Resp 14   SpO2 99%   Plan: Discharge to home.   Prescriptions  written for: None  Other home care instructions discussed: Rest, hydration  Return and follow-up instructions: I encouraged patient to return to ED with severe chest pain, especially if the pain is crushing or pressure-like and spreads to the arms, back, neck, or jaw, or if they have associated sweating, vomiting, or shortness of breath with the pain, or significant pain with activity. We discussed that the evaluation here today indicates a low-risk of serious cause of chest pain, including heart trouble or a blood clot, but no evaluation is perfect.   Follow-up instructions discussed: Patient encouraged to follow-up with their PCP as soon as possible.  States that her appointment to establish care is not until February.  Encouraged return here with any worsening or changing symptoms.                          Medical Decision Making Amount and/or Complexity of Data Reviewed Radiology: ordered.   For this patient's complaint of near syncope, the following emergent conditions were considered on the differential diagnosis: acute coronary syndrome, pulmonary embolism, pneumothorax, myocarditis, pericardial tamponade, aortic dissection, thoracic aortic aneurysm complication, esophageal perforation.   In regards to possibility of ACS, patient has atypical features of pain, non-ischemic and unchanged EKG and negative troponin(s). Heart score was calculated to be  2.   In regards to possibility of PE, symptoms are atypical for PE and risk profile is low, and I do not suspect PE at this time.   The patient's vital signs, pertinent lab work and imaging were reviewed and interpreted as discussed in the ED course. Hospitalization was considered for further testing, treatments, or serial exams/observation. However as patient is well-appearing, has a stable exam, and reassuring studies today, I do not feel that they warrant admission at this time. This plan was discussed with the patient who verbalizes agreement and comfort with this plan and seems reliable and able to return to the Emergency Department with worsening or changing symptoms.          Final Clinical Impression(s) / ED Diagnoses Final diagnoses:  Near syncope    Rx / DC Orders ED Discharge Orders     None         Desmond Dike 07/05/22 2028    Glynn Octave, MD 07/05/22 2158

## 2022-08-29 DIAGNOSIS — Z0000 Encounter for general adult medical examination without abnormal findings: Secondary | ICD-10-CM | POA: Diagnosis not present

## 2022-08-29 DIAGNOSIS — Z20822 Contact with and (suspected) exposure to covid-19: Secondary | ICD-10-CM | POA: Diagnosis not present

## 2022-08-29 DIAGNOSIS — R5383 Other fatigue: Secondary | ICD-10-CM | POA: Diagnosis not present

## 2022-08-29 DIAGNOSIS — Z1159 Encounter for screening for other viral diseases: Secondary | ICD-10-CM | POA: Diagnosis not present

## 2022-08-29 DIAGNOSIS — E559 Vitamin D deficiency, unspecified: Secondary | ICD-10-CM | POA: Diagnosis not present

## 2023-06-19 DIAGNOSIS — J3489 Other specified disorders of nose and nasal sinuses: Secondary | ICD-10-CM | POA: Diagnosis not present

## 2023-06-19 DIAGNOSIS — K219 Gastro-esophageal reflux disease without esophagitis: Secondary | ICD-10-CM | POA: Diagnosis not present

## 2023-06-19 NOTE — Progress Notes (Signed)
 Otolaryngology Office Note  HPI:    Jody Solis is a 52 y.o. female who presents as a consult  Patient.   Referring Provider: No ref. provider found   Chief complaint: Sinus problems.  HPI: For several months she has had chronic sinus pain and pressure especially in the left forehead area.  She has only intermittent nasal discharge.  She has mucus buildup in her throat.  She has been using a lot of peppermint candy and gum.  Otherwise she does not have any risk factors for reflux.  PMH/Meds/All/SocHx/FamHx/ROS:   History reviewed. No pertinent past medical history.  History reviewed. No pertinent surgical history.  No family history of bleeding disorders, wound healing problems or difficulty with anesthesia.       Current Outpatient Medications:  .  geriatric multivitamins-minerals (ELDERTONIC) 0.5-0.6-7-0.7 mg elix elixir, Take by mouth daily., Disp: , Rfl:   A complete ROS was performed with pertinent positives/negatives noted in the HPI. The remainder of the ROS are negative.    Physical Exam:    Temp 98.4 F (36.9 C) (Temporal)   Ht 1.568 m (5' 1.75)   Wt 80.7 kg (178 lb)   BMI 32.82 kg/m    General:  Healthy and alert, in no distress, breathing easily. Normal affect. In a pleasant mood. Head: Normocephalic, atraumatic. No masses, or scars. Eyes: Pupils are equal, and reactive to light. Vision is grossly intact. No spontaneous or gaze nystagmus. Ears: Ear canals are clear. Tympanic membranes are intact, with normal landmarks and the middle ears are clear and healthy. Hearing: Grossly normal. Nose: Nasal cavities are clear with healthy mucosa, no polyps or exudate. Airways are patent. Face: No masses or scars, facial nerve function is symmetric. Oral Cavity: No mucosal abnormalities are noted. Tongue with normal mobility. Dentition appears healthy. Oropharynx: Tonsils are symmetric. There are no mucosal masses identified. Tongue base appears normal and  healthy. Larynx/Hypopharynx: indirect exam reveals healthy, mobile vocal cords, without mucosal lesions in the hypopharynx or larynx. Chest: Deferred Neck: No palpable masses, no cervical adenopathy, no thyroid nodules or enlargement. Neuro: Cranial nerves II-XII with normal function. Balance: Normal gate. Other findings: none.   Independent Review of Additional Tests or Records:  none  Procedures:  none   Impression & Plans:  Normal head neck examination.  Suspect possible frontal sinus disease.  Recommend CT imaging to evaluate this.  Possible silent reflux from excessive mint consumption.  Recommend cut back on mint.   Medical Decision Making: #/Compl Problems  2     Data Rev  1  Management 2  (1-Straightforward, 2-Low, 3- Moderate, 4-High)  Ida VEAR Loader, MD

## 2023-06-25 NOTE — Telephone Encounter (Signed)
 I was working on precert  for pt's CT scan. She called to let me know that she was notified by her insurance that [facility] is not in network with her insurance company so she will not be moving forward with her scan.

## 2023-09-05 DIAGNOSIS — Z0000 Encounter for general adult medical examination without abnormal findings: Secondary | ICD-10-CM | POA: Diagnosis not present

## 2023-09-05 DIAGNOSIS — E559 Vitamin D deficiency, unspecified: Secondary | ICD-10-CM | POA: Diagnosis not present

## 2023-09-05 DIAGNOSIS — R5383 Other fatigue: Secondary | ICD-10-CM | POA: Diagnosis not present

## 2024-01-02 DIAGNOSIS — E559 Vitamin D deficiency, unspecified: Secondary | ICD-10-CM | POA: Diagnosis not present

## 2024-01-02 DIAGNOSIS — E789 Disorder of lipoprotein metabolism, unspecified: Secondary | ICD-10-CM | POA: Diagnosis not present

## 2024-03-24 ENCOUNTER — Other Ambulatory Visit: Payer: Self-pay

## 2024-03-24 ENCOUNTER — Emergency Department (HOSPITAL_COMMUNITY)
Admission: EM | Admit: 2024-03-24 | Discharge: 2024-03-24 | Disposition: A | Discharge status: Home / Self Care | Attending: Emergency Medicine | Admitting: Emergency Medicine

## 2024-03-24 ENCOUNTER — Encounter (HOSPITAL_COMMUNITY): Payer: Self-pay

## 2024-03-24 DIAGNOSIS — M25511 Pain in right shoulder: Secondary | ICD-10-CM | POA: Insufficient documentation

## 2024-03-24 DIAGNOSIS — M79601 Pain in right arm: Secondary | ICD-10-CM | POA: Diagnosis not present

## 2024-03-24 NOTE — ED Provider Notes (Signed)
  [place] EMERGENCY DEPARTMENT AT [facility] Provider Note   CSN: [id number] Arrival date & time: 03/24/24  9141     Patient presents with: Arm Pain   Jody Solis is a 53 y.o. female presenting to the ER with complaint of right shoulder and elbow pain.  Patient reports she has had right shoulder pain for several months.  She has had an aching pain in her right elbow for the past day.  She denies repetitive movement.  She denies any known injuries.  She does spend a lot of time typing at a desk.  She denies numbness or radiculopathy into her fingers.  Denies any trauma or neck injuries.  She denies any chest pain or pressure.  She has not used any over-the-counter medication.   HPI     Prior to Admission medications   Not on File    Allergies: Patient has no known allergies.    Review of Systems  Updated Vital Signs BP 129/75 (BP Location: Left Arm)   Pulse 97   Temp 99.1 F (37.3 C) (Oral)   Resp 18   Ht 5' 1 (1.549 m)   Wt 78.5 kg   SpO2 100%   BMI 32.69 kg/m   Physical Exam Constitutional:      General: She is not in acute distress. HENT:     Head: Normocephalic and atraumatic.  Eyes:     Conjunctiva/sclera: Conjunctivae normal.     Pupils: Pupils are equal, round, and reactive to light.  Cardiovascular:     Rate and Rhythm: Normal rate and regular rhythm.  Pulmonary:     Effort: Pulmonary effort is normal. No respiratory distress.  Abdominal:     General: There is no distension.     Tenderness: There is no abdominal tenderness.  Musculoskeletal:     Comments: Full range of motion of the upper extremities with no limitations, no reproducible radiculopathy, no AC joint tenderness, patient does have some mild tenderness of the distal biceps flexor tendon; no joint effusion or warmth overlying the elbow or shoulder  Skin:    General: Skin is warm and dry.  Neurological:     General: No focal deficit present.     Mental Status: She is alert.  Mental status is at baseline.  Psychiatric:        Mood and Affect: Mood normal.        Behavior: Behavior normal.     (all labs ordered are listed, but only abnormal results are displayed) Labs Reviewed - No data to display  EKG: None  Radiology: No results found.   Procedures   Medications Ordered in the ED - No data to display                                  Medical Decision Making  Patient may be here with suspected tendonitis versus referred nerve pain.  No indication for x-ray imaging or MRI imaging.  Low suspicion for infection or septic joint.  Recommended over-the-counter medication including NSAID or ibuprofen  in the short-term to help with her pain.  She can ice on and off as needed.  Doubt ACS.  Stable for discharge     Final diagnoses:  Right arm pain    ED Discharge Orders     None          Cottie Donnice PARAS, MD 03/24/24 [phone]

## 2024-03-24 NOTE — ED Triage Notes (Signed)
 Pt presents to ED from home C/O R shoulder pain radiating down to elbow. Shoulder pain X months, started with arm/elbow pain 1 month ago.

## 2024-06-12 ENCOUNTER — Ambulatory Visit
Admission: EM | Admit: 2024-06-12 | Discharge: 2024-06-12 | Disposition: A | Discharge status: Home / Self Care | Attending: Nurse Practitioner | Admitting: Nurse Practitioner

## 2024-06-12 DIAGNOSIS — B9789 Other viral agents as the cause of diseases classified elsewhere: Secondary | ICD-10-CM

## 2024-06-12 DIAGNOSIS — J329 Chronic sinusitis, unspecified: Secondary | ICD-10-CM | POA: Diagnosis not present

## 2024-06-12 DIAGNOSIS — H66001 Acute suppurative otitis media without spontaneous rupture of ear drum, right ear: Secondary | ICD-10-CM | POA: Diagnosis not present

## 2024-06-12 MED ORDER — AMOXICILLIN 875 MG PO TABS: 875.0000 mg | ORAL_TABLET | Freq: Two times a day (BID) | ORAL | 0 refills | Status: AC

## 2024-06-12 MED ORDER — FLUTICASONE PROPIONATE 50 MCG/ACT NA SUSP: 1.0000 | Freq: Every day | NASAL | 0 refills | Status: AC

## 2024-06-12 NOTE — ED Provider Notes (Signed)
 [facility]    CSN: [id number] Arrival date & time: 06/12/24  1005      History   Chief Complaint Chief Complaint  Patient presents with   Sore Throat    HPI Jody Solis is a 53 y.o. female  presents for evaluation of URI symptoms for 5 days. Patient reports associated symptoms of sinus pressure/pain with right ear pain, postnasal drip with intermittent sore throat. Denies N/V/D, [place], cough, body aches, shortness of breath. Patient does not have a hx of asthma. Patient is not an active smoker.   Reports sick contacts via family.  Pt has taken nothing OTC for symptoms. Pt has no other concerns at this time.    Sore Throat    History reviewed. No pertinent past medical history.  Patient Active Problem List   Diagnosis Date Noted   CANDIDIASIS, SKIN 11/04/2008   [facility] 08/09/2007    Past Surgical History:  Procedure Laterality Date   ABDOMINAL HYSTERECTOMY  2003   ANKLE SURGERY     CESAREAN SECTION     UTERINE FIBROID SURGERY  1995    OB History   No obstetric history on file.      Home Medications    Prior to Admission medications   Medication Sig Start Date End Date Taking? Authorizing Provider  amoxicillin  (AMOXIL ) 875 MG tablet Take 1 tablet (875 mg total) by mouth 2 (two) times daily for 10 days. 06/12/24 06/22/24 Yes Azzam Mehra, Jodi R, NP  fluticasone  (FLONASE ) 50 MCG/ACT nasal spray Place 1 spray into both nostrils daily. 06/12/24  Yes Loreda Myla SAUNDERS, NP    Family History Family History  Problem Relation Age of Onset   Healthy Sister    Healthy Brother    Healthy Brother    Healthy Sister    Healthy Sister    Healthy Sister    Healthy Sister    Healthy Sister    Lupus Sister    Asthma Mother    Hypertension Mother    Diabetes Father    Hypertension Father     Social History Social History   Tobacco Use   Smoking status: Never   Smokeless tobacco: Never  Vaping Use   Vaping status: Never Used  Substance Use Topics    Alcohol use: No   Drug use: No     Allergies   Patient has no known allergies.   Review of Systems Review of Systems  HENT:  Positive for congestion, ear pain, postnasal drip, sinus pressure, sinus pain and sore throat.      Physical Exam Triage Vital Signs ED Triage Vitals [06/12/24 1038]  Encounter Vitals Group     BP 109/74     Girls Systolic BP Percentile      Girls Diastolic BP Percentile      Boys Systolic BP Percentile      Boys Diastolic BP Percentile      Pulse Rate 77     Resp 16     Temp 99 F (37.2 C)     Temp src      SpO2 98 %     Weight      Height      Head Circumference      Peak Flow      Pain Score 0     Pain Loc      Pain Education      Exclude from Growth Chart    No data found.  Updated Vital Signs BP 109/74  Pulse 77   Temp 99 F (37.2 C)   Resp 16   SpO2 98%   Visual Acuity Right Eye Distance:   Left Eye Distance:   Bilateral Distance:    Right Eye Near:   Left Eye Near:    Bilateral Near:     Physical Exam Vitals and nursing note reviewed.  Constitutional:      General: She is not in acute distress.    Appearance: She is well-developed. She is not ill-appearing.  HENT:     Head: Normocephalic and atraumatic.     Right Ear: Ear canal normal. Tympanic membrane is erythematous.     Left Ear: Tympanic membrane and ear canal normal.     Nose: Congestion present.     Right Turbinates: Not swollen or pale.     Left Turbinates: Not swollen or pale.     Right Sinus: Frontal sinus tenderness present. No maxillary sinus tenderness.     Left Sinus: Frontal sinus tenderness present. No maxillary sinus tenderness.     Mouth/Throat:     Mouth: Mucous membranes are moist.     Pharynx: Oropharynx is clear. Uvula midline. Postnasal drip present. No posterior oropharyngeal erythema.     Tonsils: No tonsillar exudate or tonsillar abscesses.  Eyes:     Conjunctiva/sclera: Conjunctivae normal.     Pupils: Pupils are equal, round, and  reactive to light.  Cardiovascular:     Rate and Rhythm: Normal rate and regular rhythm.     Heart sounds: Normal heart sounds.  Pulmonary:     Effort: Pulmonary effort is normal.     Breath sounds: Normal breath sounds. No wheezing or rhonchi.  Musculoskeletal:     Cervical back: Normal range of motion and neck supple.  Lymphadenopathy:     Cervical: No cervical adenopathy.  Skin:    General: Skin is warm and dry.  Neurological:     General: No focal deficit present.     Mental Status: She is alert and oriented to person, place, and time.  Psychiatric:        Mood and Affect: Mood normal.        Behavior: Behavior normal.      UC Treatments / Results  Labs (all labs ordered are listed, but only abnormal results are displayed) Labs Reviewed - No data to display  EKG   Radiology No results found.  Procedures Procedures (including critical care time)  Medications Ordered in UC Medications - No data to display  Initial Impression / Assessment and Plan / UC Course  I have reviewed the triage vital signs and the nursing notes.  Pertinent labs & imaging results that were available during my care of the patient were reviewed by me and considered in my medical decision making (see chart for details).     Reviewed exam and symptoms with patient.  No red flags.  Will start amoxicillin  for right otitis media.  Flonase  daily for her sinusitis symptoms.  Nasal rinses as tolerated.  Encourage rest fluids and PCP follow-up if symptoms do not improve.  ER precautions reviewed Final Clinical Impressions(s) / UC Diagnoses   Final diagnoses:  Acute suppurative otitis media of right ear without spontaneous rupture of tympanic membrane, recurrence not specified  Viral sinusitis     Discharge Instructions      Start amoxicillin  twice daily for 10 days for your ear infection.  Start Flonase  daily to help with your sinus pressure/pain.  Nasal rinses/Nettie Potts as you tolerate.  Lots of rest and fluids.  Please follow-up with your PCP if your symptoms do not improve.  Please go to the emergency room for any worsening symptoms.  I hope you feel better soon!    ED Prescriptions     Medication Sig Dispense Auth. Provider   fluticasone  (FLONASE ) 50 MCG/ACT nasal spray Place 1 spray into both nostrils daily. 15.8 mL Linn Goetze, Jodi R, NP   amoxicillin  (AMOXIL ) 875 MG tablet Take 1 tablet (875 mg total) by mouth 2 (two) times daily for 10 days. 20 tablet Onis Markoff, Jodi R, NP      PDMP not reviewed this encounter.   Loreda Myla SAUNDERS, NP 06/12/24 1104

## 2024-06-12 NOTE — Discharge Instructions (Addendum)
 Start amoxicillin  twice daily for 10 days for your ear infection.  Start Flonase  daily to help with your sinus pressure/pain.  Nasal rinses/Nettie Potts as you tolerate.  Lots of rest and fluids.  Please follow-up with your PCP if your symptoms do not improve.  Please go to the emergency room for any worsening symptoms.  I hope you feel better soon!

## 2024-06-12 NOTE — ED Triage Notes (Signed)
 Pt present with c/o sore throat, cough and nasal congestion x Sunday. Pt states she has sinus pressure and rt ear fullness. Pt states she has not taken anything at home for relief.

## 2024-06-15 ENCOUNTER — Emergency Department (HOSPITAL_COMMUNITY)

## 2024-06-15 ENCOUNTER — Other Ambulatory Visit: Payer: Self-pay

## 2024-06-15 ENCOUNTER — Emergency Department (HOSPITAL_COMMUNITY)
Admission: EM | Admit: 2024-06-15 | Discharge: 2024-06-15 | Disposition: A | Discharge status: Home / Self Care | Attending: Emergency Medicine | Admitting: Emergency Medicine

## 2024-06-15 ENCOUNTER — Encounter (HOSPITAL_COMMUNITY): Payer: Self-pay

## 2024-06-15 DIAGNOSIS — M549 Dorsalgia, unspecified: Secondary | ICD-10-CM | POA: Diagnosis not present

## 2024-06-15 DIAGNOSIS — M542 Cervicalgia: Secondary | ICD-10-CM | POA: Diagnosis not present

## 2024-06-15 DIAGNOSIS — X58XXXA Exposure to other specified factors, initial encounter: Secondary | ICD-10-CM | POA: Insufficient documentation

## 2024-06-15 DIAGNOSIS — S161XXA Strain of muscle, fascia and tendon at neck level, initial encounter: Secondary | ICD-10-CM | POA: Insufficient documentation

## 2024-06-15 LAB — COMPREHENSIVE METABOLIC PANEL WITH GFR
ALT: 15 U/L (ref 0–44)
AST: 22 U/L (ref 15–41)
Albumin: 4.4 g/dL (ref 3.5–5.0)
Alkaline Phosphatase: 124 U/L (ref 38–126)
Anion gap: 9 (ref 5–15)
BUN: 8 mg/dL (ref 6–20)
CO2: 26 mmol/L (ref 22–32)
Calcium: 9.3 mg/dL (ref 8.9–10.3)
Chloride: 104 mmol/L (ref 98–111)
Creatinine, Ser: 0.7 mg/dL (ref 0.44–1.00)
GFR, Estimated: >60 mL/min (ref >60)
Glucose, Bld: 88 mg/dL (ref 70–99)
Potassium: 3.6 mmol/L (ref 3.5–5.1)
Sodium: 139 mmol/L (ref 135–145)
Total Bilirubin: 0.4 mg/dL (ref 0.0–1.2)
Total Protein: 7.7 g/dL (ref 6.5–8.1)

## 2024-06-15 LAB — CBC WITH DIFFERENTIAL/PLATELET
Abs Immature Granulocytes: 0.01 K/uL (ref 0.00–0.07)
Basophils Absolute: 0 K/uL (ref 0.0–0.1)
Basophils Relative: 0 %
Eosinophils Absolute: 0.1 K/uL (ref 0.0–0.5)
Eosinophils Relative: 1 %
HCT: 39.9 % (ref 36.0–46.0)
Hemoglobin: 13.2 g/dL (ref 12.0–15.0)
Immature Granulocytes: 0 %
Lymphocytes Relative: 46 %
Lymphs Abs: 1.7 K/uL (ref 0.7–4.0)
MCH: 30.6 pg (ref 26.0–34.0)
MCHC: 33.1 g/dL (ref 30.0–36.0)
MCV: 92.4 fL (ref 80.0–100.0)
Monocytes Absolute: 0.4 K/uL (ref 0.1–1.0)
Monocytes Relative: 10 %
Neutro Abs: 1.6 K/uL — ABNORMAL LOW (ref 1.7–7.7)
Neutrophils Relative %: 43 %
Platelets: 273 K/uL (ref 150–400)
RBC: 4.32 MIL/uL (ref 3.87–5.11)
RDW: 11.4 % — ABNORMAL LOW (ref 11.5–15.5)
WBC: 3.8 K/uL — ABNORMAL LOW (ref 4.0–10.5)
nRBC: 0 % (ref 0.0–0.2)

## 2024-06-15 LAB — D-DIMER, QUANTITATIVE: D-Dimer, Quant: 0.27 ug{FEU}/mL (ref 0.00–0.50)

## 2024-06-15 LAB — LIPASE, BLOOD: Lipase: 22 U/L (ref 11–51)

## 2024-06-15 LAB — TROPONIN T, HIGH SENSITIVITY: Troponin T High Sensitivity: <15 ng/L (ref 0–19)

## 2024-06-15 MED ORDER — FENTANYL CITRATE (PF) 50 MCG/ML IJ SOSY: 50.0000 ug | PREFILLED_SYRINGE | Freq: Once | INTRAMUSCULAR | Status: DC

## 2024-06-15 MED ORDER — KETOROLAC TROMETHAMINE 15 MG/ML IJ SOLN
15.0000 mg | Freq: Once | INTRAMUSCULAR | Status: DC
  Filled 2024-06-15: qty 1

## 2024-06-15 MED ORDER — TIZANIDINE HCL 4 MG PO TABS
4.0000 mg | ORAL_TABLET | Freq: Once | ORAL | Status: AC
  Administered 2024-06-15: 4 mg via ORAL
  Filled 2024-06-15: qty 1

## 2024-06-15 MED ORDER — TIZANIDINE HCL 4 MG PO TABS: 4.0000 mg | ORAL_TABLET | Freq: Four times a day (QID) | ORAL | 0 refills | Status: AC | PRN

## 2024-06-15 MED ORDER — IBUPROFEN 600 MG PO TABS: 600.0000 mg | ORAL_TABLET | Freq: Four times a day (QID) | ORAL | 0 refills | Status: AC | PRN

## 2024-06-15 NOTE — ED Triage Notes (Addendum)
 Pt arrived via POV for neck pain 9/10 started Friday.  Pain with movement.     Pt is A&O x 4, NAD

## 2024-06-15 NOTE — ED Provider Notes (Signed)
 [place] EMERGENCY DEPARTMENT AT [facility] Provider Note   CSN: [id number] Arrival date & time: 06/15/24  9253     Patient presents with: Neck Pain   Jody Solis is a 53 y.o. female who presents to the ED today with primary complaint of pain at the base of her neck and also between the scapulae.  This started 2 days prior and has progressively worsened since.  Previously managed by urgent care for otitis media and sinusitis with amoxicillin  twice daily.  Facial pressure and otalgia have improved however over the last several days she has noticed increasing pain in the base of her neck and in the upper back.  This pain worsens with deep inspiration, also worsens with rotation of the torso.  She denies having any overt dyspnea, but does state that when ambulating she does notice increase in her pain, and endorses mild shortness of breath.    Neck Pain      Prior to Admission medications   Medication Sig Start Date End Date Taking? Authorizing Provider  ibuprofen  (ADVIL ) 600 MG tablet Take 1 tablet (600 mg total) by mouth every 6 (six) hours as needed. 06/15/24  Yes Myriam Dorn BROCKS, PA  tiZANidine  (ZANAFLEX ) 4 MG tablet Take 1 tablet (4 mg total) by mouth every 6 (six) hours as needed for muscle spasms. 06/15/24  Yes Myriam Dorn BROCKS, PA  amoxicillin  (AMOXIL ) 875 MG tablet Take 1 tablet (875 mg total) by mouth 2 (two) times daily for 10 days. 06/12/24 06/22/24  Mayer, Jodi R, NP  fluticasone  (FLONASE ) 50 MCG/ACT nasal spray Place 1 spray into both nostrils daily. 06/12/24   Mayer, Jodi R, NP    Allergies: Patient has no known allergies.    Review of Systems  Respiratory:  Positive for cough and shortness of breath.   Musculoskeletal:  Positive for back pain and neck pain.  All other systems reviewed and are negative.   Updated Vital Signs BP 129/74 (BP Location: Left Arm)   Pulse 89   Temp 99 F (37.2 C) (Oral)   Resp 16   SpO2 100%   Physical  Exam Vitals and nursing note reviewed.  Constitutional:      General: She is awake. She is not in acute distress.    Appearance: Normal appearance. She is well-developed and well-groomed.  HENT:     Head: Normocephalic and atraumatic.     Right Ear: Hearing, tympanic membrane, ear canal and external ear normal.     Left Ear: Hearing, tympanic membrane, ear canal and external ear normal.     Nose: Nose normal.     Mouth/Throat:     Mouth: Mucous membranes are moist.     Pharynx: Oropharynx is clear. Uvula midline.     Tonsils: No tonsillar exudate or tonsillar abscesses.  Eyes:     General: Lids are normal. Vision grossly intact. Gaze aligned appropriately.     Extraocular Movements: Extraocular movements intact.     Conjunctiva/sclera: Conjunctivae normal.     Pupils: Pupils are equal, round, and reactive to light.  Neck:     Thyroid: No thyroid mass or thyromegaly.     Trachea: Trachea and phonation normal.     Comments: Increased pain with rotation of the neck as well as lateral flexion in either direction.  No midline spinal tenderness however there is muscular tenderness at the base of the neck, specifically between the scapulae. Cardiovascular:     Rate and Rhythm: Normal rate and  regular rhythm.     Pulses: Normal pulses.     Heart sounds: Normal heart sounds, S1 normal and S2 normal. No murmur heard.    No friction rub. No gallop.  Pulmonary:     Effort: Pulmonary effort is normal.     Breath sounds: Normal breath sounds and air entry.  Abdominal:     General: Abdomen is flat. Bowel sounds are normal.     Palpations: Abdomen is soft.  Musculoskeletal:        General: Normal range of motion.     Cervical back: Neck supple. Pain with movement and muscular tenderness present.     Right lower leg: No edema.     Left lower leg: No edema.  Lymphadenopathy:     Cervical: No cervical adenopathy.  Skin:    General: Skin is warm and dry.     Capillary Refill: Capillary refill  takes less than 2 seconds.  Neurological:     General: No focal deficit present.     Mental Status: She is alert and oriented to person, place, and time. Mental status is at baseline.     GCS: GCS eye subscore is 4. GCS verbal subscore is 5. GCS motor subscore is 6.  Psychiatric:        Mood and Affect: Mood normal.        Behavior: Behavior is cooperative.     (all labs ordered are listed, but only abnormal results are displayed) Labs Reviewed  CBC WITH DIFFERENTIAL/PLATELET - Abnormal; Notable for the following components:      Result Value   WBC 3.8 (*)    RDW 11.4 (*)    Neutro Abs 1.6 (*)    All other components within normal limits  COMPREHENSIVE METABOLIC PANEL WITH GFR  D-DIMER, QUANTITATIVE  LIPASE, BLOOD  TROPONIN T, HIGH SENSITIVITY  TROPONIN T, HIGH SENSITIVITY    EKG: EKG Interpretation Date/Time:  Sunday June 15 2024 08:25:40 EST Ventricular Rate:  77 PR Interval:  159 QRS Duration:  97 QT Interval:  358 QTC Calculation: 406 R Axis:   12  Text Interpretation: Sinus rhythm Confirmed by Bari Flank [phone]) on 06/15/2024 9:21:45 AM  Radiology: [facility] Chest Port 1 View Result Date: 06/15/2024 EXAM: 1 VIEW XRAY OF THE CHEST 06/15/2024 08:36:00 AM COMPARISON: 07/05/22 CLINICAL HISTORY: Back Pain w/ Inspiration FINDINGS: LUNGS AND PLEURA: No focal pulmonary opacity. No pleural effusion. No pneumothorax. HEART AND MEDIASTINUM: No acute abnormality of the cardiac and mediastinal silhouettes. BONES AND SOFT TISSUES: No acute osseous abnormality. IMPRESSION: 1. No acute cardiopulmonary process identified. Electronically signed by: Waddell Calk MD 06/15/2024 08:48 AM EST RP Workstation: [id number]     Procedures   Medications Ordered in the ED  tiZANidine  (ZANAFLEX ) tablet 4 mg (4 mg Oral Given 06/15/24 0945)                                    Medical Decision Making Amount and/or Complexity of Data Reviewed Labs: ordered. Radiology:  ordered.  Risk Prescription drug management.   Medical Decision Making:   Jody Solis is a 53 y.o. female who presented to the ED today with pain at base of the neck as well as the upper back, between scapulae detailed above.     Complete initial physical exam performed, notably the patient  was alert and oriented in no apparent distress.  She does have tenderness to the base  of the neck as well as to the upper back, pulmonary auscultation is unremarkable.    Reviewed and confirmed nursing documentation for past medical history, family history, social history.    Initial Assessment:   With the patient's presentation of neck and back discomfort along with shortness of breath, consider possible muscular strain, though differential given the shortness of breath and location of the pain extends to possible hepatobiliary disorder, ACS, pulmonary embolus, pneumonia, pneumothorax.  Initial Plan:   Screening labs including CBC and Metabolic panel to evaluate for infectious or metabolic etiology of disease.  Include serum lipase to evaluate for possible pancreatic etiology CXR to evaluate for structural/infectious intrathoracic pathology.  Secondary to location of pain, increased risk factors, will assess D-dimer to evaluate risk of possible pulmonary embolus. EKG and serial troponin to evaluate for cardiac pathology. Objective evaluation as below reviewed   Initial Study Results:   Laboratory  All laboratory results reviewed without evidence of clinically relevant pathology.   Exceptions include: None  EKG EKG was reviewed independently. Rate, rhythm, axis, intervals all examined and without medically relevant abnormality. ST segments without concerns for elevations.    Radiology:  All images reviewed independently. Agree with radiology report at this time.   DG Chest Port 1 View Result Date: 06/15/2024 EXAM: 1 VIEW XRAY OF THE CHEST 06/15/2024 08:36:00 AM COMPARISON: 07/05/22 CLINICAL  HISTORY: Back Pain w/ Inspiration FINDINGS: LUNGS AND PLEURA: No focal pulmonary opacity. No pleural effusion. No pneumothorax. HEART AND MEDIASTINUM: No acute abnormality of the cardiac and mediastinal silhouettes. BONES AND SOFT TISSUES: No acute osseous abnormality. IMPRESSION: 1. No acute cardiopulmonary process identified. Electronically signed by: Waddell Calk MD 06/15/2024 08:48 AM EST RP Workstation: [id number]    Reassessment and Plan:   Imaging does not show any acute changes in the chest, and EKG is equally unremarkable.  Patient was offered fentanyl  which she declined due to wanting to avoid narcotics, offered ketorolac  which the patient again declined believing this to be a narcotic.  She did except tizanidine , and states that subjectively she is improved since initial presentation.  As there are no concerning findings on the physical evaluation nor on the workup, do not believe at this time that her symptoms are consistent with either pulmonary embolus, ACS, or any other serious etiology.  Given the pain exacerbating with rotation of the neck and flexion of the neck believe this is most likely a cervical strain and will manage with tizanidine  and ibuprofen .  Outpatient follow-up to primary care recommended, discussed thoroughly with patient including return precautions of which she verbalizes understanding and agreement.  As there are no concerning findings on the physical exam, nor on the workup, this patient is stable for discharge and outpatient management as previously discussed.       Final diagnoses:  Acute strain of neck muscle, initial encounter    ED Discharge Orders          Ordered    tiZANidine  (ZANAFLEX ) 4 MG tablet  Every 6 hours PRN        06/15/24 1022    ibuprofen  (ADVIL ) 600 MG tablet  Every 6 hours PRN        06/15/24 1022               Myriam Dorn BROCKS, [place] 06/15/24 1618    Bari Roxie HERO, DO 06/16/24 0730

## 2024-08-27 ENCOUNTER — Emergency Department (HOSPITAL_COMMUNITY)
Admission: EM | Admit: 2024-08-27 | Discharge: 2024-08-27 | Disposition: A | Discharge status: Home / Self Care | Attending: Emergency Medicine | Admitting: Emergency Medicine

## 2024-08-27 DIAGNOSIS — M25512 Pain in left shoulder: Secondary | ICD-10-CM | POA: Insufficient documentation

## 2024-08-27 DIAGNOSIS — M25562 Pain in left knee: Secondary | ICD-10-CM | POA: Insufficient documentation

## 2024-08-27 DIAGNOSIS — W19XXXA Unspecified fall, initial encounter: Secondary | ICD-10-CM

## 2024-08-27 DIAGNOSIS — S40012A Contusion of left shoulder, initial encounter: Secondary | ICD-10-CM

## 2024-08-27 DIAGNOSIS — S8002XA Contusion of left knee, initial encounter: Secondary | ICD-10-CM

## 2024-08-27 DIAGNOSIS — W010XXA Fall on same level from slipping, tripping and stumbling without subsequent striking against object, initial encounter: Secondary | ICD-10-CM | POA: Insufficient documentation

## 2024-08-27 NOTE — ED Provider Notes (Signed)
 " [place] EMERGENCY DEPARTMENT AT [facility] Provider Note   CSN: [id number] Arrival date & time: 08/27/24  0700     Patient presents with: Fall   Jody Solis is a 54 y.o. female.   54 year old female here complaining of left knee and left shoulder pain after mechanical fall 2 days ago.  Patient states she slipped and fell.  Did not strike her head.  No loss of consciousness.  Complains of mild right shoulder pain is worse with movement.  Denies any bruising.  She has complaints of left knee pain that is characterized as dull and worse ambulation.  Denies any left hip or left foot or ankle pain.  States he is able to ambulate without any assistance       Prior to Admission medications  Medication Sig Start Date End Date Taking? Authorizing Provider  fluticasone  (FLONASE ) 50 MCG/ACT nasal spray Place 1 spray into both nostrils daily. 06/12/24   Mayer, Jodi R, NP  ibuprofen  (ADVIL ) 600 MG tablet Take 1 tablet (600 mg total) by mouth every 6 (six) hours as needed. 06/15/24   Myriam Dorn BROCKS, PA  tiZANidine  (ZANAFLEX ) 4 MG tablet Take 1 tablet (4 mg total) by mouth every 6 (six) hours as needed for muscle spasms. 06/15/24   Myriam Dorn BROCKS, PA    Allergies: Patient has no known allergies.    Review of Systems  All other systems reviewed and are negative.   Updated Vital Signs BP (!) 101/90 (BP Location: Right Arm)   Pulse 80   Temp 98.1 F (36.7 C) (Oral)   Resp 16   SpO2 100%   Physical Exam Vitals and nursing note reviewed.  Constitutional:      General: She is not in acute distress.    Appearance: Normal appearance. She is well-developed. She is not toxic-appearing.  HENT:     Head: Normocephalic and atraumatic.  Eyes:     General: Lids are normal.     Conjunctiva/sclera: Conjunctivae normal.     Pupils: Pupils are equal, round, and reactive to light.  Neck:     Thyroid: No thyroid mass.     Trachea: No tracheal deviation.  Cardiovascular:      Rate and Rhythm: Normal rate and regular rhythm.     Heart sounds: Normal heart sounds. No murmur heard.    No gallop.  Pulmonary:     Effort: Pulmonary effort is normal. No respiratory distress.     Breath sounds: Normal breath sounds. No stridor. No decreased breath sounds, wheezing, rhonchi or rales.  Abdominal:     General: There is no distension.     Palpations: Abdomen is soft.     Tenderness: There is no abdominal tenderness. There is no rebound.  Musculoskeletal:        General: Normal range of motion.     Left shoulder: Tenderness present. No swelling or bony tenderness. Normal range of motion.     Cervical back: Normal range of motion and neck supple.     Left knee: No deformity, effusion or ecchymosis. Normal range of motion.  Skin:    General: Skin is warm and dry.     Findings: No abrasion or rash.  Neurological:     Mental Status: She is alert and oriented to person, place, and time. Mental status is at baseline.     GCS: GCS eye subscore is 4. GCS verbal subscore is 5. GCS motor subscore is 6.  Cranial Nerves: No cranial nerve deficit.     Sensory: No sensory deficit.     Motor: Motor function is intact.     Gait: Gait is intact.  Psychiatric:        Attention and Perception: Attention normal.        Speech: Speech normal.        Behavior: Behavior normal.     (all labs ordered are listed, but only abnormal results are displayed) Labs Reviewed - No data to display  EKG: None  Radiology: No results found.   Procedures   Medications Ordered in the ED - No data to display                                  Medical Decision Making  Patient able to take several steps in the ED without any evidence of foot drop or from ambulating.  Full range of motion at her knee.  Left shoulder also without signs of visible trauma.  Discussed with patient the need for x-rays and she agrees well at this time.  Return precautions given     Final diagnoses:  None     ED Discharge Orders     None          Dasie Faden, MD 08/27/24 [phone]  "

## 2024-08-27 NOTE — ED Triage Notes (Signed)
 Patient reports fall on Sunday denies hitting head/LOC, c/o left knee/left arm pain. Patient is alert and oriented x 4. Airway patent, respirations even and unlabored. Skin normal, warm and dry.
